# Patient Record
Sex: Female | Born: 1945 | Race: White | Hispanic: No | Marital: Married | Smoking: Never smoker
Health system: Southern US, Community
[De-identification: ages and names within clinical notes are randomized; demographics above are authoritative.]

## PROBLEM LIST (undated history)

## (undated) DIAGNOSIS — M1990 Unspecified osteoarthritis, unspecified site: Secondary | ICD-10-CM

## (undated) DIAGNOSIS — I10 Essential (primary) hypertension: Secondary | ICD-10-CM

## (undated) DIAGNOSIS — Z923 Personal history of irradiation: Secondary | ICD-10-CM

## (undated) DIAGNOSIS — R112 Nausea with vomiting, unspecified: Secondary | ICD-10-CM

## (undated) DIAGNOSIS — Z973 Presence of spectacles and contact lenses: Secondary | ICD-10-CM

## (undated) DIAGNOSIS — C50919 Malignant neoplasm of unspecified site of unspecified female breast: Secondary | ICD-10-CM

## (undated) DIAGNOSIS — K219 Gastro-esophageal reflux disease without esophagitis: Secondary | ICD-10-CM

## (undated) DIAGNOSIS — Z9889 Other specified postprocedural states: Secondary | ICD-10-CM

## (undated) HISTORY — PX: EYE SURGERY: SHX253

## (undated) HISTORY — PX: DILATION AND CURETTAGE OF UTERUS: SHX78

## (undated) HISTORY — DX: Essential (primary) hypertension: I10

## (undated) HISTORY — DX: Unspecified osteoarthritis, unspecified site: M19.90

## (undated) HISTORY — PX: COLONOSCOPY: SHX174

## (undated) HISTORY — DX: Malignant neoplasm of unspecified site of unspecified female breast: C50.919

## (undated) HISTORY — PX: POLYPECTOMY: SHX149

---

## 1997-08-25 ENCOUNTER — Ambulatory Visit (HOSPITAL_COMMUNITY): Admission: RE | Admit: 1997-08-25 | Discharge: 1997-08-25 | Payer: Self-pay | Admitting: Surgery

## 1997-12-24 ENCOUNTER — Emergency Department (HOSPITAL_COMMUNITY): Admission: EM | Admit: 1997-12-24 | Discharge: 1997-12-24 | Payer: Self-pay | Admitting: Emergency Medicine

## 1997-12-24 ENCOUNTER — Encounter: Payer: Self-pay | Admitting: Emergency Medicine

## 1998-09-01 ENCOUNTER — Other Ambulatory Visit: Admission: RE | Admit: 1998-09-01 | Discharge: 1998-09-01 | Payer: Self-pay | Admitting: *Deleted

## 1999-07-23 ENCOUNTER — Encounter (INDEPENDENT_AMBULATORY_CARE_PROVIDER_SITE_OTHER): Payer: Self-pay

## 1999-07-23 ENCOUNTER — Other Ambulatory Visit: Admission: RE | Admit: 1999-07-23 | Discharge: 1999-07-23 | Payer: Self-pay | Admitting: Obstetrics and Gynecology

## 1999-07-26 ENCOUNTER — Other Ambulatory Visit: Admission: RE | Admit: 1999-07-26 | Discharge: 1999-07-26 | Payer: Self-pay | Admitting: Obstetrics and Gynecology

## 2000-05-05 ENCOUNTER — Ambulatory Visit (HOSPITAL_COMMUNITY): Admission: RE | Admit: 2000-05-05 | Discharge: 2000-05-05 | Payer: Self-pay | Admitting: Gastroenterology

## 2000-05-05 ENCOUNTER — Encounter (INDEPENDENT_AMBULATORY_CARE_PROVIDER_SITE_OTHER): Payer: Self-pay

## 2000-08-23 ENCOUNTER — Other Ambulatory Visit: Admission: RE | Admit: 2000-08-23 | Discharge: 2000-08-23 | Payer: Self-pay | Admitting: Obstetrics and Gynecology

## 2001-08-13 ENCOUNTER — Ambulatory Visit (HOSPITAL_COMMUNITY): Admission: RE | Admit: 2001-08-13 | Discharge: 2001-08-13 | Payer: Self-pay | Admitting: Obstetrics and Gynecology

## 2001-08-13 ENCOUNTER — Encounter (INDEPENDENT_AMBULATORY_CARE_PROVIDER_SITE_OTHER): Payer: Self-pay | Admitting: Specialist

## 2001-10-10 ENCOUNTER — Other Ambulatory Visit: Admission: RE | Admit: 2001-10-10 | Discharge: 2001-10-10 | Payer: Self-pay | Admitting: Obstetrics and Gynecology

## 2002-10-29 ENCOUNTER — Other Ambulatory Visit: Admission: RE | Admit: 2002-10-29 | Discharge: 2002-10-29 | Payer: Self-pay | Admitting: Obstetrics and Gynecology

## 2003-10-31 ENCOUNTER — Other Ambulatory Visit: Admission: RE | Admit: 2003-10-31 | Discharge: 2003-10-31 | Payer: Self-pay | Admitting: Obstetrics and Gynecology

## 2004-11-19 ENCOUNTER — Other Ambulatory Visit: Admission: RE | Admit: 2004-11-19 | Discharge: 2004-11-19 | Payer: Self-pay | Admitting: Obstetrics and Gynecology

## 2005-04-04 HISTORY — PX: HAND OSTEOPLASTY: SHX976

## 2005-12-26 ENCOUNTER — Other Ambulatory Visit: Admission: RE | Admit: 2005-12-26 | Discharge: 2005-12-26 | Payer: Self-pay | Admitting: Obstetrics and Gynecology

## 2007-01-16 ENCOUNTER — Other Ambulatory Visit: Admission: RE | Admit: 2007-01-16 | Discharge: 2007-01-16 | Payer: Self-pay | Admitting: Family Medicine

## 2009-05-01 ENCOUNTER — Encounter: Admission: RE | Admit: 2009-05-01 | Discharge: 2009-05-01 | Payer: Self-pay | Admitting: Otolaryngology

## 2009-09-30 ENCOUNTER — Other Ambulatory Visit: Admission: RE | Admit: 2009-09-30 | Discharge: 2009-09-30 | Payer: Self-pay | Admitting: Family Medicine

## 2010-08-20 NOTE — Op Note (Signed)
[facility]  Patient:    Jill Austin, Jill Austin Visit Number: [id number] MRN: [id number]          Service Type: DSU Location: [facility] Attending Physician:  Brynda Peon Dictated by:   Edwena Felty. Ashley Royalty, M.D. Proc. Date: 08/13/01 Admit Date:  08/13/2001                             Operative Report  PREOPERATIVE DIAGNOSES:       1. Postmenopausal bleeding.                               2. Known endometrial polyp.  POSTOPERATIVE DIAGNOSES:      1. Postmenopausal bleeding.                               2. Known endometrial polyp.                               3. Pathology pending.  PROCEDURES:                   1. Hysteroscopy.                               2. Dilation and curettage.                               3. Resection of polyp.  SURGEON:                      Cynthia P. Ashley Royalty, M.D.  ANESTHESIA:                   General by LMA.  ESTIMATED BLOOD LOSS:         Minimal.  COMPLICATIONS:                None.  SORBITOL DEFICIT:             60 cc.  DESCRIPTION OF PROCEDURE:     The patient was taken to the operating room. After the induction of adequate general anesthesia by LMA, she was prepped and draped in the usual fashion.  She had been placed in the dorsal lithotomy position prior to the induction of anesthesia because of a history of low back and hip pain.  After the induction of anesthesia and the prep and draped, the cervix was grasped with a single-tooth tenaculum and the uterus was sounded to 7 cm.  The cervix was dilated to a #31 Pratt.  The operative hysteroscope was introduced.  Sorbitol was used as a distention medium, with the pressure set at 70 mmHg.  Inspection of the uterus revealed there to be a 1 cm intrauterine wall polyp.  This was partially resected with the resectoscope.  Polyp forceps were also introduced after the scope was taken out rule out remove any section of the polyp that remained.  The hysteroscope was  reintroduced and the cavity was inspected.  The base of the polyp was resected with loop until the cavity appeared clear.  The tubal ostia were normal.  The lower uterine segment and endocervix were normal.  The hysteroscope  was then removed.  Sharp curettage was carried out and the specimen sent to pathology.  The procedure was terminated.  The patient went in satisfactory condition to postanesthesia recovery. Dictated by:   Edwena Felty. Ashley Royalty, M.D. Attending Physician:  Brynda Peon DD:  08/13/01 TD:  08/14/01 Job: [id number]

## 2010-08-20 NOTE — Procedures (Signed)
[place]  Patient:    Jill Austin, Jill Austin                        MRN: [id number] Proc. Date: 05/05/00 Attending:  Florencia Reasons, M.D. CC:         Duncan Dull, M.D.   Procedure Report  PROCEDURE:  Colonoscopy with biopsies.  INDICATION:  A 65 year old female with slight change in bowel habits and persistent unexplained left lower quadrant pain.  FINDINGS:  Some degree of sigmoid muscular thickening and prediverticular change, but no frank colitis, diverticular disease, or other source of abdominal pain identified.  DESCRIPTION OF PROCEDURE:  The nature, purpose, and risks of the procedure had been discussed with the patient, who provided written consent.  The Olympus adjustable-tension pediatric video colonoscope was advanced to the terminal ileum without significant difficulty, using some external abdominal compression to control looping, following sedation with fentanyl 70 mcg and Versed 7 mg IV.  On pullback, random mucosal biopsies were obtained along the length of the colon.  There was perhaps a little bit of erythema and granularity in the rectum, probably prep-related.  The biopsies were obtained from that area and placed in a separate jar.  No polyps, cancer, colitis, vascular malformations, or any definite diverticular disease were observed, although it appeared, especially during insertion of the scope, that there was a moderate amount of sigmoid muscular thickening and perhaps some early diverticular change without frank diverticulosis.  Retroflexion in the rectum prior to rectal biopsies was unremarkable.  There were mild to moderate internal hemorrhoids observed during pullout through the anal canal.  The patient tolerated the procedure well, and there were no apparent complications.  IMPRESSION:  Essentially normal colonoscopy.  Questionable minimal proctitis (doubt).  Mild to moderate _____ and possible early  diverticular change in the sigmoid region.  DISCUSSION:  The above findings do not necessarily account for the patients abdominal pain, which fortunately is not that bothersome for her.  PLAN:  Await pathology on biopsies.  Anticipate screening flexible sigmoidoscopy in five years.  Office follow-up as scheduled to monitor her symptoms. DD:  05/05/00 TD:  05/06/00 Job: [id number]

## 2010-10-07 ENCOUNTER — Other Ambulatory Visit: Payer: Self-pay | Admitting: Dermatology

## 2012-05-08 ENCOUNTER — Other Ambulatory Visit: Payer: Self-pay | Admitting: Radiology

## 2012-05-08 DIAGNOSIS — C50919 Malignant neoplasm of unspecified site of unspecified female breast: Secondary | ICD-10-CM

## 2012-05-08 HISTORY — DX: Malignant neoplasm of unspecified site of unspecified female breast: C50.919

## 2012-05-11 ENCOUNTER — Other Ambulatory Visit: Payer: Self-pay | Admitting: *Deleted

## 2012-05-11 ENCOUNTER — Telehealth: Payer: Self-pay | Admitting: *Deleted

## 2012-05-11 DIAGNOSIS — C50212 Malignant neoplasm of upper-inner quadrant of left female breast: Secondary | ICD-10-CM | POA: Insufficient documentation

## 2012-05-11 DIAGNOSIS — C50219 Malignant neoplasm of upper-inner quadrant of unspecified female breast: Secondary | ICD-10-CM

## 2012-05-11 NOTE — Telephone Encounter (Signed)
Confirmed [facility] for 05/16/12 at 1230 .  Instructions and contact information given.  

## 2012-05-16 ENCOUNTER — Ambulatory Visit: Payer: Medicare Other | Attending: Surgery | Admitting: Physical Therapy

## 2012-05-16 ENCOUNTER — Encounter: Payer: Self-pay | Admitting: Oncology

## 2012-05-16 ENCOUNTER — Ambulatory Visit (HOSPITAL_BASED_OUTPATIENT_CLINIC_OR_DEPARTMENT_OTHER): Payer: Medicare Other | Admitting: Oncology

## 2012-05-16 ENCOUNTER — Ambulatory Visit
Admission: RE | Admit: 2012-05-16 | Discharge: 2012-05-16 | Disposition: A | Discharge status: Home / Self Care | Payer: Medicare Other | Source: Ambulatory Visit | Attending: Radiation Oncology | Admitting: Radiation Oncology

## 2012-05-16 ENCOUNTER — Encounter: Payer: Self-pay | Admitting: *Deleted

## 2012-05-16 ENCOUNTER — Encounter (INDEPENDENT_AMBULATORY_CARE_PROVIDER_SITE_OTHER): Payer: Self-pay | Admitting: Surgery

## 2012-05-16 ENCOUNTER — Ambulatory Visit: Payer: Medicare Other

## 2012-05-16 ENCOUNTER — Other Ambulatory Visit (HOSPITAL_BASED_OUTPATIENT_CLINIC_OR_DEPARTMENT_OTHER): Payer: Medicare Other | Admitting: Lab

## 2012-05-16 ENCOUNTER — Ambulatory Visit (HOSPITAL_BASED_OUTPATIENT_CLINIC_OR_DEPARTMENT_OTHER): Payer: Medicare Other | Admitting: Surgery

## 2012-05-16 VITALS — BP 170/83 | HR 73 | Temp 97.2°F | Resp 20 | Ht 64.5 in | Wt 177.4 lb

## 2012-05-16 VITALS — BP 170/83 | HR 73 | Temp 97.2°F | Resp 20 | Ht 64.5 in | Wt 177.0 lb

## 2012-05-16 DIAGNOSIS — C50919 Malignant neoplasm of unspecified site of unspecified female breast: Secondary | ICD-10-CM

## 2012-05-16 DIAGNOSIS — IMO0001 Reserved for inherently not codable concepts without codable children: Secondary | ICD-10-CM | POA: Insufficient documentation

## 2012-05-16 DIAGNOSIS — I10 Essential (primary) hypertension: Secondary | ICD-10-CM | POA: Insufficient documentation

## 2012-05-16 DIAGNOSIS — C50219 Malignant neoplasm of upper-inner quadrant of unspecified female breast: Secondary | ICD-10-CM

## 2012-05-16 DIAGNOSIS — Z170 Estrogen receptor positive status [ER+]: Secondary | ICD-10-CM

## 2012-05-16 DIAGNOSIS — C50912 Malignant neoplasm of unspecified site of left female breast: Secondary | ICD-10-CM

## 2012-05-16 DIAGNOSIS — E782 Mixed hyperlipidemia: Secondary | ICD-10-CM | POA: Insufficient documentation

## 2012-05-16 DIAGNOSIS — R293 Abnormal posture: Secondary | ICD-10-CM | POA: Insufficient documentation

## 2012-05-16 DIAGNOSIS — K219 Gastro-esophageal reflux disease without esophagitis: Secondary | ICD-10-CM | POA: Insufficient documentation

## 2012-05-16 LAB — COMPREHENSIVE METABOLIC PANEL (CC13)
AST: 16 U/L (ref 5–34)
Albumin: 3.9 g/dL (ref 3.5–5.0)
Alkaline Phosphatase: 87 U/L (ref 40–150)
BUN: 35.9 mg/dL — ABNORMAL HIGH (ref 7.0–26.0)
Creatinine: 0.9 mg/dL (ref 0.6–1.1)
Potassium: 4.3 mEq/L (ref 3.5–5.1)

## 2012-05-16 LAB — CBC WITH DIFFERENTIAL/PLATELET
BASO%: 0.7 % (ref 0.0–2.0)
Basophils Absolute: 0 10*3/uL (ref 0.0–0.1)
EOS%: 2.1 % (ref 0.0–7.0)
HGB: 13.2 g/dL (ref 11.6–15.9)
MCH: 29.8 pg (ref 25.1–34.0)
MCHC: 33.5 g/dL (ref 31.5–36.0)
MCV: 89 fL (ref 79.5–101.0)
MONO%: 8 % (ref 0.0–14.0)
RBC: 4.44 10*6/uL (ref 3.70–5.45)
RDW: 13.6 % (ref 11.2–14.5)

## 2012-05-16 NOTE — Patient Instructions (Signed)
Offices should call you in a few days to set up surgery to remove the left breast cancer and to remove some of the sentinel lymph nodes from the left armpit area. If you have not heard from my office by Friday, please call to check on the status of the scheduling. The office number is [phone]

## 2012-05-16 NOTE — Progress Notes (Signed)
Patient ID: Jill Austin, female   DOB: 04/17/1945, 66 y.o.   MRN: [id number]  Chief Complaint  Patient presents with  . Breast Cancer    left    HPI Elveta B Fredman is a 66 y.o. female.  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 She was unaware of any mass. She had a significant other breast problems. She has a negative family history for breast cancer. She is having no breast symptoms.  HPI  Past Medical History  Diagnosis Date  . Breast cancer   . Hypertension   . Arthritis     No past surgical history on file.  No family history on file.  Social History History  Substance Use Topics  . Smoking status: Never Smoker   . Smokeless tobacco: Not on file  . Alcohol Use: 6.0 oz/week    10 Glasses of wine per week    Allergies not on file  No current outpatient prescriptions on file.   No current facility-administered medications for this visit.    Review of Systems Review of Systems  Constitutional: Negative for fever, chills and unexpected weight change.  HENT: Negative for hearing loss, congestion, sore throat, trouble swallowing and voice change.   Eyes: Negative for visual disturbance.  Respiratory: Negative for cough and wheezing.   Cardiovascular: Negative for chest pain, palpitations and leg swelling.  Gastrointestinal: Negative for nausea, vomiting, abdominal pain, diarrhea, constipation, blood in stool, abdominal distention and anal bleeding.  Genitourinary: Negative for hematuria, vaginal bleeding and difficulty urinating.  Musculoskeletal: Negative for arthralgias.  Skin: Negative for rash and wound.  Neurological: Negative for seizures, syncope and headaches.  Hematological: Negative for adenopathy. Does not bruise/bleed easily.  Psychiatric/Behavioral: Negative for confusion.    Blood pressure 170/83, pulse 73, temperature 97.2 F (36.2 C), resp. rate 20, height 5' 4.5"  (1.638 m), weight 177 lb (80.287 kg).  Physical Exam Physical Exam  Vitals reviewed. Constitutional: She is oriented to person, place, and time. She appears well-developed and well-nourished. No distress.  HENT:  Head: Normocephalic and atraumatic.  Mouth/Throat: Oropharynx is clear and moist.  Eyes: Conjunctivae and EOM are normal. Pupils are equal, round, and reactive to light. No scleral icterus.  Neck: Normal range of motion. Neck supple. No tracheal deviation present. No thyromegaly present.  Cardiovascular: Normal rate, regular rhythm, normal heart sounds and intact distal pulses.  Exam reveals no gallop and no friction rub.   No murmur heard. Pulmonary/Chest: Effort normal and breath sounds normal. No respiratory distress. She has no wheezes. She has no rales. Right breast exhibits no inverted nipple, no mass, no nipple discharge, no skin change and no tenderness. Left breast exhibits mass. Left breast exhibits no inverted nipple, no nipple discharge, no skin change and no tenderness. Breasts are symmetrical.    Mass in the left breast as diagrammed. I believe this represents hematoma and not the cancer.  Abdominal: Soft. Bowel sounds are normal. She exhibits no distension and no mass. There is no tenderness. There is no rebound and no guarding.  Musculoskeletal: Normal range of motion. She exhibits no edema and no tenderness.  Lymphadenopathy:    She has no cervical adenopathy.    She has no axillary adenopathy.       Right: No supraclavicular adenopathy present.       Left: No supraclavicular adenopathy present.  Neurological: She is alert and oriented to person, place, and time.    Skin: Skin is warm and dry. No rash noted. She is not diaphoretic. No erythema.  Psychiatric: She has a normal mood and affect. Her behavior is normal. Judgment and thought content normal.    Data Reviewed Path: ADDITIONAL INFORMATION: CHROMOGENIC IN-SITU HYBRIDIZATION Interpretation HER-2/NEU BY  CISH - NEGATIVE, NO AMPLIFICATION OF HER-2 DETECTED. THE RATIO OF HER-2: CEP 17 SIGNALS WAS 1.38. H. CATHERINE LI MD Pathologist, Electronic Signature ( Signed 05/11/2012) PROGNOSTIC INDICATORS - ACIS Results IMMUNOHISTOCHEMICAL AND MORPHOMETRIC ANALYSIS BY THE AUTOMATED CELLULAR IMAGING SYSTEM (ACIS) Estrogen Receptor (Negative, <1%): 100%,POSITIVE, STRONG STAINING INTENSITY Progesterone Receptor (Negative, <1%): 100%, POSITIVE, STRONG STAINING INTENSITY Proliferation Marker Ki67 by M IB-1 (Low<20%): 12% All controls stained appropriately H. CATHERINE LI MD Pathologist, Electronic Signature ( Signed 05/11/2012) FINAL DIAGNOSIS 1 of 2 FINAL for Klingberg, Breane B ([id number]) Diagnosis Breast, left, needle core biopsy - INVASIVE MAMMARY CARCINOMA. - SEE COMMENT. Microscopic Comment Although definitive grading of breast carcinoma is best done on excision, the features of the tumor from the left needle core biopsy are compatible with a grade II breast carcinoma. Breast prognostic marker will be performed and reported in an addendum. The findings are called to [facility] on 05/09/12. Dr. Rund has seen this case in consultation with agreement. (CRR:gt, 05/09/12) ROBERT HILLARD MD Pathologist, Electronic Signature  I have reviewed the pathology slides with the pathologist, and the mammogram films etc with the radiologist Assessmen    Clinical stage I left breast cancer upper outer quadrant, receptor positive, HER-2 negative     Plan    I have explained the pathophysiology and staging of breast cancer with particular attention to her exact situation. We discussed the multidisciplinary approach to breast cancer which often includes both medical and radiation oncology consultations.  We also discussed surgical options for the treatment of breast cancer including lumpectomy and mastectomy with possible reconstructive surgery. In addition we talked about the evaluation and  management of lymph nodes including a description of sentinel lymph node biopsy and axillary dissections. We reviewed potential complications and risks including bleeding, infection, numbness,  lymphedema, and the potential need for additional surgery.  She understands that for patients who are candidate for lumpectomy or mastectomy there is an equal survival rate with either technique, but a slightly higher local recurrence rate with lumpectomy. In addition she knows that a lumpectomy usually requires postoperative radiation as part of the management of the breast cancer.  We have discussed the likely postoperative course and plans for followup.  I have given the patient some written information that reviewed all of these issues. I believe her questions are answered and that she has a good understanding of the issues.  I think she is an excellent candidate for a wire localized lumpectomy and sentinel lymph node evaluation. I do not think she will benefit from a mastectomy. I don't think she is a candidate for a MammoSite device as his tumor is fairly high and there is not a lot of surrounding breast tissue.        Torion Hulgan J 05/16/2012, 10:07 AM    

## 2012-05-16 NOTE — Progress Notes (Signed)
Checked in new patient. No financial issues. Patient does have her [facility] packet.

## 2012-05-16 NOTE — Progress Notes (Signed)
Radiation Oncology         [phone]) [phone] ________________________________  Initial outpatient Consultation - Date: 05/16/2012   Name: Jill Austin MRN: [id number]   DOB: 25-Sep-1945  REFERRING PHYSICIAN: Currie Paris, MD  DIAGNOSIS: The encounter diagnosis was Cancer of upper-inner quadrant of female breast.  HISTORY OF PRESENT ILLNESS::Jill Austin is a 67 y.o. female  who underwent a screening mammogram. An abnormality was seen in the left breast. A biopsy revealed invasive mammary carcinoma with some ductal formation which was felt to be grade 2 ER/PR positive HER-2 negative Ki-67 12%. She is done well since her biopsy. She is interested in breast conservation. He is accompanied by her husband today.  PREVIOUS RADIATION THERAPY: No  PAST MEDICAL HISTORY:  has a past medical history of Breast cancer; Hypertension; and Arthritis.    PAST SURGICAL HISTORY:No past surgical history on file.  FAMILY HISTORY: @[facility] @  SOCIAL HISTORY:  History  Substance Use Topics  . Smoking status: Never Smoker   . Smokeless tobacco: Not on file  . Alcohol Use: 6.0 oz/week    10 Glasses of wine per week    ALLERGIES: Penicillins and Vicodin  MEDICATIONS:  Current Outpatient Prescriptions  Medication Sig Dispense Refill  . amLODipine (NORVASC) 5 MG tablet       . bisoprolol-hydrochlorothiazide (ZIAC) 5-6.25 MG per tablet       . Calcium Carbonate-Vitamin D (CALCIUM 600 + D PO) Take 600 mg by mouth daily.      . fish oil-omega-3 fatty acids 1000 MG capsule Take 2 g by mouth daily.      Marland Kitchen glucosamine-chondroitin 500-400 MG tablet Take 1 tablet by mouth 3 (three) times daily.      . meloxicam (MOBIC) 15 MG tablet       . Multiple Vitamin (MULTIVITAMIN) capsule Take 1 capsule by mouth daily.      Marland Kitchen omeprazole (PRILOSEC) 20 MG capsule       . sertraline (ZOLOFT) 100 MG tablet        No current facility-administered medications for this encounter.    REVIEW OF SYSTEMS:  A 15 point  review of systems is documented in the electronic medical record. This was obtained by the nursing staff. However, I reviewed this with the patient to discuss relevant findings and make appropriate changes.  Pertinent items are noted in HPI.   PHYSICAL EXAM: There were no vitals filed for this visit.. She is a pleasant female in no distress sitting comfortably examining table. She has large pendulous breasts bilaterally. He is some bruising in the lateral aspect of the right breast. This is superior and lateral. As no abnormalities of the left breast. She has no palpable cervical or supraclavicular adenopathy. No palpable axillary adenopathy bilaterally.  LABORATORY DATA:  Lab Results  Component Value Date   WBC 6.1 05/16/2012   HGB 13.2 05/16/2012   HCT 39.5 05/16/2012   MCV 89.0 05/16/2012   PLT 180 05/16/2012   Lab Results  Component Value Date   NA 139 05/16/2012   K 4.3 05/16/2012   CL 103 05/16/2012   CO2 29 05/16/2012   Lab Results  Component Value Date   ALT 12 05/16/2012   AST 16 05/16/2012   ALKPHOS 87 05/16/2012   BILITOT 1.11 05/16/2012     RADIOGRAPHY: No results found.    IMPRESSION: 67 year old female with a T1 C. N0 invasive mammary carcinoma of the left breast  PLAN: I discussed breast conservation with Jill Austin.  She is interested in proceeding forward. We discussed the role of radiation in decreasing local failure in patients who undergo breast conservation. We discussed the process of simulation the placement tattoos. We discussed 6 weeks of treatment as an outpatient. We discussed the possible side effects including but not limited to skin redness and fatigue. We discussed that she is a candidate for Oncotype testing and that if she did require chemotherapy that radiation will begin following chemotherapy. She has met with the surgeon, medical oncologist, a member of our patient family support team as well as our physical therapist.  I spent 40 minutes  face to face with the  patient and more than 50% of that time was spent in counseling and/or coordination of care.   ------------------------------------------------  Lurline Hare, MD

## 2012-05-17 ENCOUNTER — Telehealth: Payer: Self-pay | Admitting: Oncology

## 2012-05-17 NOTE — Progress Notes (Signed)
ID: Jill Austin   DOB: 03/29/1946  MR#: [id number]  [id number]#:[id number]  PCP: [name], MD GYN: Aram Beecham Romine) SU: Cicero Duck OTHER MD: Lurline Hare, Jeralyn Ruths, Christine McCuen   HISTORY OF PRESENT ILLNESS: Jill Austin had routine screening mammography at [facility] 05/05/2011, showing heterogeneously dense breasts. There were no findings of concern. On 05/07/2012, a new spiculated mass was noted in the left breast, and the patient was recalled for left breast ultrasound 05/08/2012. This confirmed a hypoechoic mass measuring 1.3 cm, at the 11:00 position 11 cm from the nipple. The left axilla was unremarkable.  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  The patient's subsequent history is as detailed below.  INTERVAL HISTORY: Jill Austin was seen at the multidisciplinary breast cancer clinic 05/16/2012 accompanied by her husband Ron  REVIEW OF SYSTEMS: She was having no symptoms that led to the recent mammogram, which was routinely scheduled. Currently she denies unusual headaches, visual changes, cough, phlegm production, pleurisy, chest pain or pressure, change in bowel or bladder habits, unexplained fatigue or unexplained weight weight loss, rash, bleeding, fever, or persistent or unusual pain. A detailed review of systems was entirely negative.  PAST MEDICAL HISTORY: Past Medical History  Diagnosis Date  . Breast cancer   . Hypertension   . Arthritis    tinnitus and hearing loss left year; history of remote panic attacks; GERD; wrinkle over the left retina  PAST SURGICAL HISTORY: No past surgical history on file. Hand surgery under [facility] Removal of uterine polyp  FAMILY HISTORY No family history on file. The patient's father died at the age of 59 from heart problems. The  patient's mother died at the age of 63, from heart problems. Jill Austin had one brother, no sisters. There is no history of breast or ovarian cancer in the family to her knowledge.  GYNECOLOGIC HISTORY: Menarche age 67, first live birth age 67, she is GX P2, last menstrual period approximately 1998. She took hormone replacement for approximately 7 years.  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 Her husband [name] (goes by "wrong") is retired from [employer]. He is having significant spine problems. Their daughter Kwanza Cancelliere teaches 4-year-olds in [employer] in [place]; daughter Jatziri Goffredo lives in [place] and is a homemaker. The patient has 4 grandchildren. She attends the [employer]  ADVANCED DIRECTIVES: In place  HEALTH MAINTENANCE: History  Substance Use Topics  . Smoking status: Never Smoker   . Smokeless tobacco: Not on file  . Alcohol Use: 6.0 oz/week    10 Glasses of wine per week     Colonoscopy: 2002  PAP: 2011  Bone density: August 2012; lowest T score -1.7  Lipid panel:  Allergies  Allergen Reactions  . Penicillins Other (See Comments)    headache  . Vicodin (Hydrocodone-Acetaminophen) Other (See Comments)    Hyper     Current Outpatient Prescriptions  Medication Sig Dispense Refill  . amLODipine (NORVASC) 5 MG tablet       . bisoprolol-hydrochlorothiazide (ZIAC) 5-6.25 MG per tablet       . Calcium Carbonate-Vitamin D (CALCIUM 600 + D PO) Take 600 mg by mouth daily.      . fish oil-omega-3 fatty acids 1000 MG capsule Take 2 g by mouth daily.      Marland Kitchen  glucosamine-chondroitin 500-400 MG tablet Take 1 tablet by mouth 3 (three) times daily.      . meloxicam (MOBIC) 15 MG tablet       . Multiple Vitamin (MULTIVITAMIN) capsule Take 1 capsule by mouth daily.      Marland Kitchen omeprazole (PRILOSEC) 20 MG capsule       . sertraline (ZOLOFT) 100 MG tablet        No current facility-administered  medications for this visit.    OBJECTIVE: Middle-aged white woman who appears well Filed Vitals:   05/16/12 0909  BP: 170/83  Pulse: 73  Temp: 97.2 F (36.2 C)  Resp: 20     Body mass index is 29.99 kg/(m^2).    ECOG FS: 0  Sclerae unicteric Oropharynx clear No cervical or supraclavicular adenopathy Lungs no rales or rhonchi Heart regular rate and rhythm Abd benign MSK no focal spinal tenderness, no peripheral edema Neuro: nonfocal Breasts: The right breast is unremarkable. The left breast is status post recent biopsy. There is moderate ecchymosis. There are no skin or nipple changes of concern. The left axilla is benign.   LAB RESULTS: Lab Results  Component Value Date   WBC 6.1 05/16/2012   NEUTROABS 3.6 05/16/2012   HGB 13.2 05/16/2012   HCT 39.5 05/16/2012   MCV 89.0 05/16/2012   PLT 180 05/16/2012      Chemistry      Component Value Date/Time   NA 139 05/16/2012 0849   K 4.3 05/16/2012 0849   CL 103 05/16/2012 0849   CO2 29 05/16/2012 0849   BUN 35.9* 05/16/2012 0849   CREATININE 0.9 05/16/2012 0849      Component Value Date/Time   CALCIUM 9.8 05/16/2012 0849   ALKPHOS 87 05/16/2012 0849   AST 16 05/16/2012 0849   ALT 12 05/16/2012 0849   BILITOT 1.11 05/16/2012 0849       Lab Results  Component Value Date   LABCA2 24 05/16/2012    No components found with this basename: [id number]    No results found for this basename: INR,  in the last 168 hours  Urinalysis No results found for this basename: colorurine, appearanceur, labspec, phurine, glucoseu, hgbur, bilirubinur, ketonesur, proteinur, urobilinogen, nitrite, leukocytesur    STUDIES: No results found.  ASSESSMENT: 67 y.o. [place] woman status post left breast biopsy 05/08/2012 for a clinical T1c N0, stage IA invasive ductal carcinoma, grade 2, estrogen and progesterone receptor positive both at 100%, HER-2/neu negative, with an MIB-1 of 12%.  PLAN: Jill Austin is a very good candidate for breast conservation  surgery, to be followed by radiation and antiestrogen therapy. An Oncotype DX will be sent and will help [place] make the decision whether or not she will need chemotherapy, as per NCCN guidelines. However my expectation is that the benefit from chemotherapy will be small enough that he we should be able to safely forgo that option.  We spent the better part of her hour-long visit today discussing the biology of her tumor, treatment options, and decision-making. She is going to return to see me in approximately 5 weeks by which time we should have the Oncotype results and should be able to make a definitive decision regarding systemic adjuvant treatment.   MAGRINAT,GUSTAV C    05/17/2012

## 2012-05-17 NOTE — Telephone Encounter (Signed)
S/w the pt and she is aware of her md appt in march

## 2012-05-22 ENCOUNTER — Other Ambulatory Visit: Payer: Self-pay | Admitting: Oncology

## 2012-05-22 ENCOUNTER — Telehealth: Payer: Self-pay | Admitting: *Deleted

## 2012-05-22 NOTE — Telephone Encounter (Signed)
Spoke to pt concerning [facility] from 2/12.  Pt denies questions or concerns regarding dx or treatment care plan.  Confirmed surgery date and future appts.  Encourage pt to call with needs.  Received verbal understanding.  Contact information given.

## 2012-05-23 ENCOUNTER — Telehealth: Payer: Self-pay | Admitting: Oncology

## 2012-05-23 NOTE — Telephone Encounter (Signed)
Per GM moved 3/21 appt o 3/28 @ 9am due to he will be out of town (Friday). lmonvm for pt re change w/new d/t. Mailed schedule.

## 2012-06-04 ENCOUNTER — Other Ambulatory Visit: Payer: Self-pay

## 2012-06-04 ENCOUNTER — Encounter (HOSPITAL_BASED_OUTPATIENT_CLINIC_OR_DEPARTMENT_OTHER): Payer: Self-pay | Admitting: *Deleted

## 2012-06-04 ENCOUNTER — Ambulatory Visit
Admission: RE | Admit: 2012-06-04 | Discharge: 2012-06-04 | Disposition: A | Discharge status: Home / Self Care | Payer: Medicare Other | Source: Ambulatory Visit | Attending: Surgery | Admitting: Surgery

## 2012-06-04 ENCOUNTER — Encounter (HOSPITAL_BASED_OUTPATIENT_CLINIC_OR_DEPARTMENT_OTHER)
Admission: RE | Admit: 2012-06-04 | Discharge: 2012-06-04 | Disposition: A | Discharge status: Home / Self Care | Payer: Medicare Other | Source: Ambulatory Visit | Attending: Surgery | Admitting: Surgery

## 2012-06-04 LAB — BASIC METABOLIC PANEL
BUN: 22 mg/dL (ref 6–23)
Chloride: 103 mEq/L (ref 96–112)
GFR calc Af Amer: >90 mL/min (ref >90)
Glucose, Bld: 103 mg/dL — ABNORMAL HIGH (ref 70–99)
Potassium: 3.8 mEq/L (ref 3.5–5.1)
Sodium: 139 mEq/L (ref 135–145)

## 2012-06-04 NOTE — Progress Notes (Signed)
Pt here for cxr-labs ekg

## 2012-06-05 ENCOUNTER — Ambulatory Visit (HOSPITAL_BASED_OUTPATIENT_CLINIC_OR_DEPARTMENT_OTHER): Payer: Medicare Other | Admitting: Certified Registered Nurse Anesthetist

## 2012-06-05 ENCOUNTER — Ambulatory Visit (HOSPITAL_BASED_OUTPATIENT_CLINIC_OR_DEPARTMENT_OTHER)
Admission: RE | Admit: 2012-06-05 | Discharge: 2012-06-05 | Disposition: A | Discharge status: Home / Self Care | Payer: Medicare Other | Source: Ambulatory Visit | Attending: Surgery | Admitting: Surgery

## 2012-06-05 ENCOUNTER — Ambulatory Visit (HOSPITAL_COMMUNITY): Payer: Medicare Other

## 2012-06-05 ENCOUNTER — Encounter (HOSPITAL_BASED_OUTPATIENT_CLINIC_OR_DEPARTMENT_OTHER): Payer: Self-pay | Admitting: *Deleted

## 2012-06-05 ENCOUNTER — Encounter (HOSPITAL_BASED_OUTPATIENT_CLINIC_OR_DEPARTMENT_OTHER): Payer: Self-pay | Admitting: Certified Registered Nurse Anesthetist

## 2012-06-05 ENCOUNTER — Encounter (HOSPITAL_BASED_OUTPATIENT_CLINIC_OR_DEPARTMENT_OTHER)
Admission: RE | Disposition: A | Discharge status: Home / Self Care | Payer: Self-pay | Source: Ambulatory Visit | Attending: Surgery

## 2012-06-05 DIAGNOSIS — D0590 Unspecified type of carcinoma in situ of unspecified breast: Secondary | ICD-10-CM

## 2012-06-05 DIAGNOSIS — C50219 Malignant neoplasm of upper-inner quadrant of unspecified female breast: Secondary | ICD-10-CM | POA: Insufficient documentation

## 2012-06-05 DIAGNOSIS — I10 Essential (primary) hypertension: Secondary | ICD-10-CM | POA: Insufficient documentation

## 2012-06-05 DIAGNOSIS — M129 Arthropathy, unspecified: Secondary | ICD-10-CM | POA: Insufficient documentation

## 2012-06-05 DIAGNOSIS — C50212 Malignant neoplasm of upper-inner quadrant of left female breast: Secondary | ICD-10-CM

## 2012-06-05 DIAGNOSIS — Z170 Estrogen receptor positive status [ER+]: Secondary | ICD-10-CM | POA: Insufficient documentation

## 2012-06-05 HISTORY — PX: BREAST LUMPECTOMY WITH NEEDLE LOCALIZATION AND AXILLARY SENTINEL LYMPH NODE BX: SHX5760

## 2012-06-05 HISTORY — DX: Other specified postprocedural states: Z98.890

## 2012-06-05 HISTORY — DX: Gastro-esophageal reflux disease without esophagitis: K21.9

## 2012-06-05 HISTORY — DX: Presence of spectacles and contact lenses: Z97.3

## 2012-06-05 HISTORY — DX: Nausea with vomiting, unspecified: R11.2

## 2012-06-05 SURGERY — BREAST LUMPECTOMY WITH NEEDLE LOCALIZATION AND AXILLARY SENTINEL LYMPH NODE BX
Anesthesia: General | Site: Breast | Laterality: Left | Wound class: Clean

## 2012-06-05 MED ORDER — GLYCOPYRROLATE 0.2 MG/ML IJ SOLN
INTRAMUSCULAR | Status: DC | PRN
  Administered 2012-06-05: .2 mg via INTRAVENOUS

## 2012-06-05 MED ORDER — PROPOFOL 10 MG/ML IV BOLUS
INTRAVENOUS | Status: DC | PRN
  Administered 2012-06-05: 150 mg via INTRAVENOUS

## 2012-06-05 MED ORDER — ACETAMINOPHEN 10 MG/ML IV SOLN
1000.0000 mg | Freq: Once | INTRAVENOUS | Status: AC
  Administered 2012-06-05: 1000 mg via INTRAVENOUS

## 2012-06-05 MED ORDER — DEXAMETHASONE SODIUM PHOSPHATE 4 MG/ML IJ SOLN
INTRAMUSCULAR | Status: DC | PRN
  Administered 2012-06-05: 10 mg via INTRAVENOUS

## 2012-06-05 MED ORDER — PHENYLEPHRINE HCL 10 MG/ML IJ SOLN
10.0000 mg | INTRAVENOUS | Status: DC | PRN
  Administered 2012-06-05: 50 ug/min via INTRAVENOUS

## 2012-06-05 MED ORDER — OXYCODONE HCL 5 MG/5ML PO SOLN: 5.0000 mg | Freq: Once | ORAL | Status: AC | PRN

## 2012-06-05 MED ORDER — MIDAZOLAM HCL 2 MG/2ML IJ SOLN
1.0000 mg | INTRAMUSCULAR | Status: DC | PRN
  Administered 2012-06-05: 1 mg via INTRAVENOUS

## 2012-06-05 MED ORDER — FENTANYL CITRATE 0.05 MG/ML IJ SOLN: 50.0000 ug | INTRAMUSCULAR | Status: DC | PRN

## 2012-06-05 MED ORDER — ONDANSETRON HCL 4 MG/2ML IJ SOLN
INTRAMUSCULAR | Status: DC | PRN
  Administered 2012-06-05: 4 mg via INTRAVENOUS

## 2012-06-05 MED ORDER — HYDROMORPHONE HCL PF 1 MG/ML IJ SOLN
0.2500 mg | INTRAMUSCULAR | Status: DC | PRN
  Administered 2012-06-05 (×3): 0.25 mg via INTRAVENOUS

## 2012-06-05 MED ORDER — TECHNETIUM TC 99M SULFUR COLLOID FILTERED
1.0000 | Freq: Once | INTRAVENOUS | Status: AC | PRN
  Administered 2012-06-05: 1 via INTRADERMAL

## 2012-06-05 MED ORDER — BUPIVACAINE HCL (PF) 0.25 % IJ SOLN
INTRAMUSCULAR | Status: DC | PRN
  Administered 2012-06-05: 30 mL

## 2012-06-05 MED ORDER — SODIUM CHLORIDE 0.9 % IJ SOLN
INTRAMUSCULAR | Status: DC | PRN
  Administered 2012-06-05: 15:00:00

## 2012-06-05 MED ORDER — FENTANYL CITRATE 0.05 MG/ML IJ SOLN
50.0000 ug | INTRAMUSCULAR | Status: DC | PRN
  Administered 2012-06-05: 50 ug via INTRAVENOUS

## 2012-06-05 MED ORDER — HYDROMORPHONE HCL 2 MG PO TABS: 2.0000 mg | ORAL_TABLET | ORAL | Status: DC | PRN

## 2012-06-05 MED ORDER — CHLORHEXIDINE GLUCONATE 4 % EX LIQD: 1.0000 "application " | Freq: Once | CUTANEOUS | Status: DC

## 2012-06-05 MED ORDER — CIPROFLOXACIN IN D5W 400 MG/200ML IV SOLN
400.0000 mg | INTRAVENOUS | Status: AC
  Administered 2012-06-05: 400 mg via INTRAVENOUS

## 2012-06-05 MED ORDER — SCOPOLAMINE 1 MG/3DAYS TD PT72
1.0000 | MEDICATED_PATCH | TRANSDERMAL | Status: DC
  Administered 2012-06-05: 1.5 mg via TRANSDERMAL

## 2012-06-05 MED ORDER — OXYCODONE HCL 5 MG PO TABS
5.0000 mg | ORAL_TABLET | Freq: Once | ORAL | Status: AC | PRN
  Administered 2012-06-05: 5 mg via ORAL

## 2012-06-05 MED ORDER — LACTATED RINGERS IV SOLN
INTRAVENOUS | Status: DC
  Administered 2012-06-05: 14:00:00 via INTRAVENOUS

## 2012-06-05 MED ORDER — MIDAZOLAM HCL 2 MG/2ML IJ SOLN: 1.0000 mg | INTRAMUSCULAR | Status: DC | PRN

## 2012-06-05 MED ORDER — FENTANYL CITRATE 0.05 MG/ML IJ SOLN
INTRAMUSCULAR | Status: DC | PRN
  Administered 2012-06-05 (×2): 25 ug via INTRAVENOUS

## 2012-06-05 MED ORDER — LIDOCAINE HCL (CARDIAC) 20 MG/ML IV SOLN
INTRAVENOUS | Status: DC | PRN
  Administered 2012-06-05: 50 mg via INTRAVENOUS

## 2012-06-05 MED ORDER — EPHEDRINE SULFATE 50 MG/ML IJ SOLN
INTRAMUSCULAR | Status: DC | PRN
  Administered 2012-06-05 (×2): 10 mg via INTRAVENOUS

## 2012-06-05 SURGICAL SUPPLY — 63 items
APPLIER CLIP 11 MED OPEN (CLIP)
APPLIER CLIP 9.375 MED OPEN (MISCELLANEOUS)
BINDER BREAST XLRG (GAUZE/BANDAGES/DRESSINGS) ×2 IMPLANT
BLADE HEX COATED 2.75 (ELECTRODE) ×2 IMPLANT
BLADE SURG 15 STRL LF DISP TIS (BLADE) ×2 IMPLANT
BLADE SURG 15 STRL SS (BLADE) ×2
CANISTER SUCTION 1200CC (MISCELLANEOUS) ×2 IMPLANT
CHLORAPREP W/TINT 26ML (MISCELLANEOUS) ×2 IMPLANT
CLIP APPLIE 11 MED OPEN (CLIP) IMPLANT
CLIP APPLIE 9.375 MED OPEN (MISCELLANEOUS) IMPLANT
CLIP TI MEDIUM 6 (CLIP) ×4 IMPLANT
CLIP TI WIDE RED SMALL 6 (CLIP) ×6 IMPLANT
CLOTH BEACON ORANGE TIMEOUT ST (SAFETY) ×2 IMPLANT
COVER MAYO STAND STRL (DRAPES) ×2 IMPLANT
COVER PROBE 5X48 (MISCELLANEOUS)
COVER PROBE W GEL 5X96 (DRAPES) ×2 IMPLANT
COVER TABLE BACK 60X90 (DRAPES) ×2 IMPLANT
DECANTER SPIKE VIAL GLASS SM (MISCELLANEOUS) IMPLANT
DERMABOND ADVANCED (GAUZE/BANDAGES/DRESSINGS) ×2
DERMABOND ADVANCED .7 DNX12 (GAUZE/BANDAGES/DRESSINGS) ×2 IMPLANT
DEVICE DUBIN W/COMP PLATE 8390 (MISCELLANEOUS) ×2 IMPLANT
DRAIN CHANNEL 19F RND (DRAIN) IMPLANT
DRAPE LAPAROSCOPIC ABDOMINAL (DRAPES) ×2 IMPLANT
DRAPE SURG 17X23 STRL (DRAPES) IMPLANT
DRAPE UTILITY XL STRL (DRAPES) ×2 IMPLANT
DRSG EMULSION OIL 3X3 NADH (GAUZE/BANDAGES/DRESSINGS) IMPLANT
ELECT BLADE 4.0 EZ CLEAN MEGAD (MISCELLANEOUS)
ELECT REM PT RETURN 9FT ADLT (ELECTROSURGICAL) ×2
ELECTRODE BLDE 4.0 EZ CLN MEGD (MISCELLANEOUS) IMPLANT
ELECTRODE REM PT RTRN 9FT ADLT (ELECTROSURGICAL) ×1 IMPLANT
EVACUATOR SILICONE 100CC (DRAIN) IMPLANT
GLOVE BIO SURGEON STRL SZ 6.5 (GLOVE) ×4 IMPLANT
GLOVE EUDERMIC 7 POWDERFREE (GLOVE) ×4 IMPLANT
GLOVE SKINSENSE NS SZ7.0 (GLOVE) ×1
GLOVE SKINSENSE STRL SZ7.0 (GLOVE) ×1 IMPLANT
GOWN PREVENTION PLUS XLARGE (GOWN DISPOSABLE) ×4 IMPLANT
GOWN PREVENTION PLUS XXLARGE (GOWN DISPOSABLE) ×2 IMPLANT
KIT CVR 48X5XPRB PLUP LF (MISCELLANEOUS) IMPLANT
KIT MARKER MARGIN INK (KITS) ×2 IMPLANT
NDL SAFETY ECLIPSE 18X1.5 (NEEDLE) ×1 IMPLANT
NEEDLE HYPO 18GX1.5 SHARP (NEEDLE) ×1
NEEDLE HYPO 25X1 1.5 SAFETY (NEEDLE) ×4 IMPLANT
NS IRRIG 1000ML POUR BTL (IV SOLUTION) ×2 IMPLANT
PACK BASIN DAY SURGERY FS (CUSTOM PROCEDURE TRAY) ×2 IMPLANT
PENCIL BUTTON HOLSTER BLD 10FT (ELECTRODE) ×2 IMPLANT
PIN SAFETY STERILE (MISCELLANEOUS) IMPLANT
SHEET MEDIUM DRAPE 40X70 STRL (DRAPES) ×2 IMPLANT
SLEEVE SCD COMPRESS KNEE MED (MISCELLANEOUS) ×2 IMPLANT
SPONGE GAUZE 4X4 12PLY (GAUZE/BANDAGES/DRESSINGS) IMPLANT
SPONGE INTESTINAL PEANUT (DISPOSABLE) IMPLANT
SPONGE LAP 18X18 X RAY DECT (DISPOSABLE) IMPLANT
SPONGE LAP 4X18 X RAY DECT (DISPOSABLE) ×4 IMPLANT
SUT ETHILON 2 0 FS 18 (SUTURE) IMPLANT
SUT ETHILON 3 0 FSL (SUTURE) IMPLANT
SUT MNCRL AB 4-0 PS2 18 (SUTURE) ×4 IMPLANT
SUT VIC AB 4-0 BRD 54 (SUTURE) IMPLANT
SUT VICRYL 3-0 CR8 SH (SUTURE) ×4 IMPLANT
SYR CONTROL 10ML LL (SYRINGE) ×4 IMPLANT
TOWEL OR 17X24 6PK STRL BLUE (TOWEL DISPOSABLE) ×2 IMPLANT
TOWEL OR NON WOVEN STRL DISP B (DISPOSABLE) ×2 IMPLANT
TUBE CONNECTING 20X1/4 (TUBING) ×2 IMPLANT
WATER STERILE IRR 1000ML POUR (IV SOLUTION) IMPLANT
YANKAUER SUCT BULB TIP NO VENT (SUCTIONS) ×2 IMPLANT

## 2012-06-05 NOTE — Interval H&P Note (Signed)
History and Physical Interval Note:  06/05/2012 1:27 PM  Jill Austin  has presented today for surgery, with the diagnosis of left breast cancer   The various methods of treatment have been discussed with the patient and family. After consideration of risks, benefits and other options for treatment, the patient has consented to  Procedure(s): BREAST LUMPECTOMY WITH NEEDLE LOCALIZATION AND AXILLARY SENTINEL LYMPH NODE BX (Left) as a surgical intervention .  The patient's history has been reviewed, patient examined, no change in status, stable for surgery.  I have reviewed the patient's chart and labs.  Questions were answered to the patient's satisfaction.     STRECK,CHRISTIAN J

## 2012-06-05 NOTE — Anesthesia Postprocedure Evaluation (Signed)
  Anesthesia Post Note  Patient: Jill Austin  Procedure(s) Performed: Procedure(s) (LRB): BREAST LUMPECTOMY WITH NEEDLE LOCALIZATION AND AXILLARY SENTINEL LYMPH NODE BX (Left)  Anesthesia type: GA  Patient location: PACU  Post pain: Pain level controlled  Post assessment: Post-op Vital signs reviewed  Last Vitals:  Filed Vitals:   06/05/12 1630  BP: 125/109  Pulse: 72  Temp:   Resp: 17    Post vital signs: Reviewed  Level of consciousness: sedated  Complications: No apparent anesthesia complications

## 2012-06-05 NOTE — Anesthesia Preprocedure Evaluation (Signed)
Anesthesia Evaluation  Patient identified by MRN, date of birth, ID band Patient awake    Reviewed: Allergy & Precautions, H&P , NPO status , Patient's Chart, lab work & pertinent test results, reviewed documented beta blocker date and time   History of Anesthesia Complications (+) PONV  Airway Mallampati: II TM Distance: >3 FB Neck ROM: Full    Dental no notable dental hx. (+) Teeth Intact and Dental Advisory Given   Pulmonary neg pulmonary ROS,  breath sounds clear to auscultation  Pulmonary exam normal       Cardiovascular hypertension, On Medications and On Home Beta Blockers Rhythm:Regular Rate:Normal     Neuro/Psych negative neurological ROS  negative psych ROS   GI/Hepatic Neg liver ROS, GERD-  Medicated and Controlled,  Endo/Other  negative endocrine ROS  Renal/GU negative Renal ROS  negative genitourinary   Musculoskeletal   Abdominal   Peds  Hematology negative hematology ROS (+)   Anesthesia Other Findings   Reproductive/Obstetrics negative OB ROS                           Anesthesia Physical Anesthesia Plan  ASA: II  Anesthesia Plan: General   Post-op Pain Management:    Induction: Intravenous  Airway Management Planned: LMA  Additional Equipment:   Intra-op Plan:   Post-operative Plan: Extubation in OR  Informed Consent: I have reviewed the patients History and Physical, chart, labs and discussed the procedure including the risks, benefits and alternatives for the proposed anesthesia with the patient or authorized representative who has indicated his/her understanding and acceptance.   Dental advisory given  Plan Discussed with: CRNA  Anesthesia Plan Comments:         Anesthesia Quick Evaluation

## 2012-06-05 NOTE — H&P (View-Only) (Signed)
Patient ID: Jill Austin, female   DOB: October 05, 1945, 67 y.o.   MRN: [id number]  Chief Complaint  Patient presents with  . Breast Cancer    left    HPI Jill Austin is a 67 y.o. female.  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 She was unaware of any mass. She had a significant other breast problems. She has a negative family history for breast cancer. She is having no breast symptoms.  HPI  Past Medical History  Diagnosis Date  . Breast cancer   . Hypertension   . Arthritis     No past surgical history on file.  No family history on file.  Social History History  Substance Use Topics  . Smoking status: Never Smoker   . Smokeless tobacco: Not on file  . Alcohol Use: 6.0 oz/week    10 Glasses of wine per week    Allergies not on file  No current outpatient prescriptions on file.   No current facility-administered medications for this visit.    Review of Systems Review of Systems  Constitutional: Negative for fever, chills and unexpected weight change.  HENT: Negative for hearing loss, congestion, sore throat, trouble swallowing and voice change.   Eyes: Negative for visual disturbance.  Respiratory: Negative for cough and wheezing.   Cardiovascular: Negative for chest pain, palpitations and leg swelling.  Gastrointestinal: Negative for nausea, vomiting, abdominal pain, diarrhea, constipation, blood in stool, abdominal distention and anal bleeding.  Genitourinary: Negative for hematuria, vaginal bleeding and difficulty urinating.  Musculoskeletal: Negative for arthralgias.  Skin: Negative for rash and wound.  Neurological: Negative for seizures, syncope and headaches.  Hematological: Negative for adenopathy. Does not bruise/bleed easily.  Psychiatric/Behavioral: Negative for confusion.    Blood pressure 170/83, pulse 73, temperature 97.2 F (36.2 C), resp. rate 20, height 5' 4.5"  (1.638 m), weight 177 lb (80.287 kg).  Physical Exam Physical Exam  Vitals reviewed. Constitutional: She is oriented to person, place, and time. She appears well-developed and well-nourished. No distress.  HENT:  Head: Normocephalic and atraumatic.  Mouth/Throat: Oropharynx is clear and moist.  Eyes: Conjunctivae and EOM are normal. Pupils are equal, round, and reactive to light. No scleral icterus.  Neck: Normal range of motion. Neck supple. No tracheal deviation present. No thyromegaly present.  Cardiovascular: Normal rate, regular rhythm, normal heart sounds and intact distal pulses.  Exam reveals no gallop and no friction rub.   No murmur heard. Pulmonary/Chest: Effort normal and breath sounds normal. No respiratory distress. She has no wheezes. She has no rales. Right breast exhibits no inverted nipple, no mass, no nipple discharge, no skin change and no tenderness. Left breast exhibits mass. Left breast exhibits no inverted nipple, no nipple discharge, no skin change and no tenderness. Breasts are symmetrical.    Mass in the left breast as diagrammed. I believe this represents hematoma and not the cancer.  Abdominal: Soft. Bowel sounds are normal. She exhibits no distension and no mass. There is no tenderness. There is no rebound and no guarding.  Musculoskeletal: Normal range of motion. She exhibits no edema and no tenderness.  Lymphadenopathy:    She has no cervical adenopathy.    She has no axillary adenopathy.       Right: No supraclavicular adenopathy present.       Left: No supraclavicular adenopathy present.  Neurological: She is alert and oriented to person, place, and time.  Skin: Skin is warm and dry. No rash noted. She is not diaphoretic. No erythema.  Psychiatric: She has a normal mood and affect. Her behavior is normal. Judgment and thought content normal.    Data Reviewed Path: ADDITIONAL INFORMATION: CHROMOGENIC IN-SITU HYBRIDIZATION Interpretation HER-2/NEU BY  CISH - NEGATIVE, NO AMPLIFICATION OF HER-2 DETECTED. THE RATIO OF HER-2: CEP 17 SIGNALS WAS 1.38. H. CATHERINE LI MD Pathologist, Electronic Signature ( Signed 05/11/2012) PROGNOSTIC INDICATORS - ACIS Results IMMUNOHISTOCHEMICAL AND MORPHOMETRIC ANALYSIS BY THE AUTOMATED CELLULAR IMAGING SYSTEM (ACIS) Estrogen Receptor (Negative, <1%): 100%,POSITIVE, STRONG STAINING INTENSITY Progesterone Receptor (Negative, <1%): 100%, POSITIVE, STRONG STAINING INTENSITY Proliferation Marker Ki67 by M IB-1 (Low<20%): 12% All controls stained appropriately Abigail Miyamoto MD Pathologist, Electronic Signature ( Signed 05/11/2012) FINAL DIAGNOSIS 1 of 2 FINAL for Jill Austin, Jill Austin ([id number]) Diagnosis Breast, left, needle core biopsy - INVASIVE MAMMARY CARCINOMA. - SEE COMMENT. Microscopic Comment Although definitive grading of breast carcinoma is best done on excision, the features of the tumor from the left needle core biopsy are compatible with a grade II breast carcinoma. Breast prognostic marker will be performed and reported in an addendum. The findings are called to [facility] on 05/09/12. Dr. Frederica Kuster has seen this case in consultation with agreement. (CRR:gt, 05/09/12) Zandra Abts MD Pathologist, Electronic Signature  I have reviewed the pathology slides with the pathologist, and the mammogram films etc with the radiologist Assessmen    Clinical stage I left breast cancer upper outer quadrant, receptor positive, HER-2 negative     Plan    I have explained the pathophysiology and staging of breast cancer with particular attention to her exact situation. We discussed the multidisciplinary approach to breast cancer which often includes both medical and radiation oncology consultations.  We also discussed surgical options for the treatment of breast cancer including lumpectomy and mastectomy with possible reconstructive surgery. In addition we talked about the evaluation and  management of lymph nodes including a description of sentinel lymph node biopsy and axillary dissections. We reviewed potential complications and risks including bleeding, infection, numbness,  lymphedema, and the potential need for additional surgery.  She understands that for patients who are candidate for lumpectomy or mastectomy there is an equal survival rate with either technique, but a slightly higher local recurrence rate with lumpectomy. In addition she knows that a lumpectomy usually requires postoperative radiation as part of the management of the breast cancer.  We have discussed the likely postoperative course and plans for followup.  I have given the patient some written information that reviewed all of these issues. I believe her questions are answered and that she has a good understanding of the issues.  I think she is an excellent candidate for a wire localized lumpectomy and sentinel lymph node evaluation. I do not think she will benefit from a mastectomy. I don't think she is a candidate for a MammoSite device as his tumor is fairly high and there is not a lot of surrounding breast tissue.        Dhanush Jokerst J 05/16/2012, 10:07 AM

## 2012-06-05 NOTE — Interval H&P Note (Signed)
History and Physical Interval Note:  06/05/2012 1:17 PM  Jill Austin  has presented today for surgery, with the diagnosis of left breast cancer   The various methods of treatment have been discussed with the patient and family. After consideration of risks, benefits and other options for treatment, the patient has consented to  Procedure(s): BREAST LUMPECTOMY WITH NEEDLE LOCALIZATION AND AXILLARY SENTINEL LYMPH NODE BX (Left) as a surgical intervention .  The patient's history has been reviewed, patient examined, no change in status, stable for surgery.  I have reviewed the patient's chart and labs.  Questions were answered to the patient's satisfaction.     Katarzyna Wolven J

## 2012-06-05 NOTE — Op Note (Signed)
Jill Austin [birth year] [id number] 05/16/2012  Preoperative diagnosis: left breast cancer, clinical stage I, upper inner quadrant  Postoperative diagnosis: the same  Procedure: wire localized left partial mastectomy with blue dye injection and axillary sentinel lymph node dissection  Surgeon: Currie Paris, MD, FACS  Assistant: Ralene Muskrat, PA-S  Anesthesia: General   Clinical History and Indications: this patient was recently diagnosed with a stage I invasive mammary carcinoma in about the 12:00 position of the left breast. After discussion of alternative management she elected to proceed to a wire localized lumpectomy and sentinel lymph node excision.    Description of Procedure: the patient is in the preoperative area, the plan is discussed and reviewed, or localizing films reviewed in the left breast marked as the operative site.  The patient was taken to the operating room and after satisfactory general anesthesia was obtained a timeout was done. 5 cc of dilute methylene blue was injected subareolar and massaged in. A full prep and drape was done.  The guidewire entered somewhat laterally and tracked for xylene and medial direction. I was able to palpate the mass was a wire going through the area. I made a curvilinear incision directly over this, raise skin flaps were fairly thin, and a wide excision of the tumor going down to and including the fascia. By palpation was well around the tumor in all directions. Specimen mammogram confirmed the tumor appear to be in the middle of the specimen.  I irrigated make sure everything was dry. I mobilized the breast off of the fascia. I put 20 cc of 0.25% plain Marcaine for local and postop pain management. I clips to mark the margins. I closed the breast in layers with 3-0 Vicryl followed by 4-0 Monocryl subcuticular Dermabond on the skin.  Using the neoprobe identified a hot area in the axilla and a transverse incision and deepened the  incision to it entered the axillary fat. Assault lymphatic and was trying to trace that but then found a blue lymph node. Adjacent was another lymph node that had counts. These were both removed. The counts on the smaller of the 2 was about 500. The blue note, which was somewhat larger, had basically no counts.CC removed there were no other palpably abnormal nodes, no blue lymph nodes or lymphatics identified, and no other hot areas.  I injected 10 cc of 0.25% plain Marcaine and closed in layers with 3-0 Vicryl, 4-0 Monocryl subcuticular, plus Dermabond  The patient tolerated the procedure well. There no operative complications. Counts were correct. Blood loss was minimal..  Currie Paris, MD, FACS 06/05/2012 3:28 PM

## 2012-06-05 NOTE — Transfer of Care (Signed)
Immediate Anesthesia Transfer of Care Note  Patient: Jill Austin  Procedure(s) Performed: Procedure(s): BREAST LUMPECTOMY WITH NEEDLE LOCALIZATION AND AXILLARY SENTINEL LYMPH NODE BX (Left)  Patient Location: PACU  Anesthesia Type:General  Level of Consciousness: awake  Airway & Oxygen Therapy: Patient Spontanous Breathing and Patient connected to face mask oxygen  Post-op Assessment: Report given to PACU RN and Post -op Vital signs reviewed and stable  Post vital signs: Reviewed and stable  Complications: No apparent anesthesia complications

## 2012-06-07 ENCOUNTER — Telehealth (INDEPENDENT_AMBULATORY_CARE_PROVIDER_SITE_OTHER): Payer: Self-pay | Admitting: General Surgery

## 2012-06-07 ENCOUNTER — Encounter (HOSPITAL_BASED_OUTPATIENT_CLINIC_OR_DEPARTMENT_OTHER): Payer: Self-pay | Admitting: Surgery

## 2012-06-07 ENCOUNTER — Encounter: Payer: Self-pay | Admitting: *Deleted

## 2012-06-07 NOTE — Progress Notes (Signed)
Got Ok from [facility] to Order Oncotype Dx test.  Ordered Oncotype Dx test w/ [facility].  Faxed request to [facility].  Called Tammy at [facility] to verify that she received request.  Faxed PAC to [employer].

## 2012-06-07 NOTE — Telephone Encounter (Signed)
Message copied by Liliana Cline on Thu Jun 07, 2012  2:01 PM ------      Message from: Currie Paris      Created: Thu Jun 07, 2012 12:04 PM       Tell the patient that her margins are OK and her lymph nodes are negative. I will discuss in detail in the office. ------

## 2012-06-07 NOTE — Telephone Encounter (Signed)
Patient aware path results are good. Lymph nodes negative and margins ok. She will follow up in the office at her scheduled appt and call with any questions prior.  

## 2012-06-10 ENCOUNTER — Other Ambulatory Visit: Payer: Self-pay | Admitting: Oncology

## 2012-06-14 ENCOUNTER — Encounter: Payer: Self-pay | Admitting: *Deleted

## 2012-06-14 NOTE — Progress Notes (Signed)
Re-faxed PAC to Samantha at [facility].

## 2012-06-19 ENCOUNTER — Encounter (INDEPENDENT_AMBULATORY_CARE_PROVIDER_SITE_OTHER): Payer: Self-pay | Admitting: Surgery

## 2012-06-19 ENCOUNTER — Ambulatory Visit (INDEPENDENT_AMBULATORY_CARE_PROVIDER_SITE_OTHER): Payer: Medicare Other | Admitting: Surgery

## 2012-06-19 VITALS — BP 142/80 | HR 64 | Temp 98.2°F | Resp 16 | Ht 65.0 in | Wt 175.0 lb

## 2012-06-19 DIAGNOSIS — C50212 Malignant neoplasm of upper-inner quadrant of left female breast: Secondary | ICD-10-CM

## 2012-06-19 DIAGNOSIS — C50219 Malignant neoplasm of upper-inner quadrant of unspecified female breast: Secondary | ICD-10-CM

## 2012-06-19 NOTE — Patient Instructions (Addendum)
See me again in about twom months, after you have finished radiation

## 2012-06-19 NOTE — Progress Notes (Signed)
NAME: Jill Austin                                            DOB: Aug 06, 1945 DATE: 06/19/2012                                                  MRN: [id number]  CC:  Chief Complaint  Patient presents with  . Follow-up    1st po lumpy    HPI: This patient comes in for post op follow-up .Sheunderwent left lumpectomy and SLN on 06/05/12. She feels that she is doing well.Some discomfort at the lumpectomy site  PE:  VITAL SIGNS: BP 142/80  Pulse 64  Temp(Src) 98.2 F (36.8 C) (Temporal)  Resp 16  Ht 5\' 5"  (1.651 m)  Wt 175 lb (79.379 kg)  BMI 29.12 kg/m2  General: The patient appears to be healthy, NAD Breast: some selling at the lumpectopmy site, no infection. SLN site soft, no infection  DATA REVIEWED: Path noted  IMPRESSION: The patient is doing well S/P lumpectomy and SLN.  Path: Diagnosis 1. Breast, lumpectomy, Left TUMOR # 1 (1.8 CM). - INVASIVE DUCTAL CARCINOMA, GRADE III. - LYMPHOVASCULAR INVASION IDENTIFIED. - INVASIVE TUMOR IS 0.3 CM FROM NEAREST MARGIN (POSTERIOR). - DUCTAL CARCINOMA IN SITU, GRADE III. TUMOR # 2 (0.6 CM) - INVASIVE DUCTAL CARCINOMA, GRADE III - INVASIVE TUMOR IS 0.4 CM FROM NEAREST MARGIN (POSTERIOR). - DUCTAL CARCINOMA IN SITU, GRADE III 2. Lymph node, sentinel, biopsy, Left axillary - ONE LYMPH NODE, NEGATIVE FOR TUMOR (0/1). 3. Lymph node, sentinel, biopsy, Left axillary #2 - ONE LYMPH NODE, NEGATIVE FOR TUMOR (0/1).    PLAN: Will see after radiation. She has med and rad onc follow up pending. Oncotype is also pen ding. Gave her a copy of path report and discussed

## 2012-06-21 ENCOUNTER — Encounter: Payer: Self-pay | Admitting: *Deleted

## 2012-06-21 NOTE — Progress Notes (Signed)
Received Oncotype Dx results of 14.  Gave copy to MD.  Rochele Pages copy to Med Rec to scan.

## 2012-06-22 ENCOUNTER — Ambulatory Visit: Payer: Medicare Other | Admitting: Oncology

## 2012-06-29 ENCOUNTER — Ambulatory Visit (HOSPITAL_BASED_OUTPATIENT_CLINIC_OR_DEPARTMENT_OTHER): Payer: Medicare Other | Admitting: Oncology

## 2012-06-29 VITALS — BP 174/90 | HR 71 | Temp 98.3°F | Resp 20 | Ht 65.0 in | Wt 177.0 lb

## 2012-06-29 DIAGNOSIS — C50212 Malignant neoplasm of upper-inner quadrant of left female breast: Secondary | ICD-10-CM

## 2012-06-29 DIAGNOSIS — C50219 Malignant neoplasm of upper-inner quadrant of unspecified female breast: Secondary | ICD-10-CM

## 2012-06-29 DIAGNOSIS — Z170 Estrogen receptor positive status [ER+]: Secondary | ICD-10-CM

## 2012-06-29 NOTE — Progress Notes (Signed)
ID: [name]   DOB: [birth year]  MR#: [id number]  WUJ#:[id number]  PCP: [name], MD GYN: Meredeth Ide SU: Cicero Duck OTHER MD: Lurline Hare, Jeralyn Ruths, Christine McCuen   HISTORY OF PRESENT ILLNESS: Jill Austin had routine screening mammography at [facility] 05/05/2011, showing heterogeneously dense breasts. There were no findings of concern. On 05/07/2012, a new spiculated mass was noted in the left breast, and the patient was recalled for left breast ultrasound 05/08/2012. This confirmed a hypoechoic mass measuring 1.3 cm, at the 11:00 position 11 cm from the nipple. The left axilla was unremarkable.  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  The patient's subsequent history is as detailed below.  INTERVAL HISTORY: Jill Austin returns today for followup of her breast cancer accompanied by her husband Ron. Since her last visit here she had her definitive left lumpectomy. She did well with the surgery, with no significant pain, swelling, fever, or lymphedema  REVIEW OF SYSTEMS: A detailed review of systems today was otherwise entirely stable.  PAST MEDICAL HISTORY: Past Medical History  Diagnosis Date  . Breast cancer   . Hypertension   . Arthritis   . GERD (gastroesophageal reflux disease)   . Wears glasses   . PONV (postoperative nausea and vomiting)    tinnitus and hearing loss left year; history of remote panic attacks; GERD; wrinkle over the left retina  PAST SURGICAL HISTORY: Past Surgical History  Procedure Laterality Date  . Dilation and curettage of uterus    . Hand osteoplasty  2007    left [facility]  . Colonoscopy    . Breast lumpectomy with needle localization and axillary sentinel lymph node bx Left 06/05/2012    Procedure: BREAST LUMPECTOMY WITH NEEDLE LOCALIZATION  AND AXILLARY SENTINEL LYMPH NODE BX;  Surgeon: Currie Paris, MD;  Location: [facility];  Service: General;  Laterality: Left;   Hand surgery under Jill Austin Removal of uterine polyp  FAMILY HISTORY No family history on file. The patient's father died at the age of 42 from heart problems. The patient's mother died at the age of 39, from heart problems. Jill Austin had one brother, no sisters. There is no history of breast or ovarian cancer in the family to her knowledge.  GYNECOLOGIC HISTORY: Menarche age 21, first live birth age 65, she is GX P2, last menstrual period approximately 1998. She took hormone replacement for approximately 7 years.  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 Her husband Jill Austin (goes by Jill Austin is retired from [employer]. He is having significant spine problems. Their daughter Jill Austin teaches 4-year-olds in [employer] in [place]; daughter Jill Austin lives in [place] and is a homemaker. The patient has 4 grandchildren. She attends the [employer]  ADVANCED DIRECTIVES: In place  HEALTH MAINTENANCE: History  Substance Use Topics  . Smoking status: Never Smoker   . Smokeless tobacco: Not on file  . Alcohol Use: 6.0 oz/week    10 Glasses of wine per week     Colonoscopy: 2002  PAP: 2011  Bone density: August 2012; lowest T score -1.7  Lipid panel:  Allergies  Allergen Reactions  . Penicillins Other (See Comments)    headache  . Vicodin (Hydrocodone-Acetaminophen) Other (See Comments)    Hyper     Current  Outpatient Prescriptions  Medication Sig Dispense Refill  . amLODipine (NORVASC) 5 MG tablet       . bisoprolol-hydrochlorothiazide (ZIAC) 5-6.25 MG per tablet       . Calcium Carbonate-Vitamin D (CALCIUM 600 + D PO) Take 600 mg by mouth daily.      . fish oil-omega-3 fatty acids 1000 MG capsule Take 2 g by mouth daily.      Marland Kitchen  glucosamine-chondroitin 500-400 MG tablet Take 1 tablet by mouth 3 (three) times daily.      Marland Kitchen HYDROmorphone (DILAUDID) 2 MG tablet Take 1 tablet (2 mg total) by mouth every 4 (four) hours as needed for pain. Take as needed for pain  30 tablet  0  . meloxicam (MOBIC) 15 MG tablet       . Multiple Vitamin (MULTIVITAMIN) capsule Take 1 capsule by mouth daily.      Marland Kitchen omeprazole (PRILOSEC) 20 MG capsule       . sertraline (ZOLOFT) 100 MG tablet        No current facility-administered medications for this visit.    OBJECTIVE: Middle-aged white woman in no acute distress. Filed Vitals:   06/29/12 0859  BP: 174/90  Pulse: 71  Temp: 98.3 F (36.8 C)  Resp: 20     Body mass index is 29.45 kg/(m^2).    ECOG FS: 1  Sclerae unicteric Oropharynx clear No cervical or supraclavicular adenopathy Lungs no rales or rhonchi Heart regular rate and rhythm Abd benign MSK no focal spinal tenderness, no peripheral edema Neuro: nonfocal, well oriented, pleasant affect Breasts: The right breast is unremarkable. The left breast is status post  Lumpectomy. The incisions are healing nicely. There is no significant swelling or erythema. There is no dehiscence. The overall cosmetic result is good. The left axilla is benign.   LAB RESULTS: Lab Results  Component Value Date   WBC 6.1 05/16/2012   NEUTROABS 3.6 05/16/2012   HGB 14.0 06/05/2012   HCT 39.5 05/16/2012   MCV 89.0 05/16/2012   PLT 180 05/16/2012      Chemistry      Component Value Date/Time   NA 139 06/04/2012 1200   NA 139 05/16/2012 0849   K 3.8 06/04/2012 1200   K 4.3 05/16/2012 0849   CL 103 06/04/2012 1200   CL 103 05/16/2012 0849   CO2 26 06/04/2012 1200   CO2 29 05/16/2012 0849   BUN 22 06/04/2012 1200   BUN 35.9* 05/16/2012 0849   CREATININE 0.79 06/04/2012 1200   CREATININE 0.9 05/16/2012 0849      Component Value Date/Time   CALCIUM 9.2 06/04/2012 1200   CALCIUM 9.8 05/16/2012 0849   ALKPHOS 87 05/16/2012 0849   AST 16 05/16/2012 0849   ALT 12  05/16/2012 0849   BILITOT 1.11 05/16/2012 0849       Lab Results  Component Value Date   LABCA2 24 05/16/2012    No components found with this basename: [id number]    No results found for this basename: INR,  in the last 168 hours  Urinalysis No results found for this basename: colorurine,  appearanceur,  labspec,  phurine,  glucoseu,  hgbur,  bilirubinur,  ketonesur,  proteinur,  urobilinogen,  nitrite,  leukocytesur    STUDIES: No results found.  ASSESSMENT: 67 y.o. [place] woman status post left lumpectomy and sentinel lymph node sampling 06/05/2012 for a mpT1c pN0, stage IA invasive ductal carcinoma, grade 3, estrogen and progesterone receptor positive both at 100%, HER-2/neu negative, with  an MIB-1 of 12%.  (1) Oncotype DX showed a score of 14, predicting a distant recurrence risk of 9% within the next 10 years if the patient's only systemic treatment is tamoxifen for 5 years  PLAN: Jeanise understands that in addition to the risk prognosis provided by Oncotype this test gives [place] predictive information regarding the possible benefit of chemotherapy. In patients with a score like hers, the benefit of chemotherapy is minimal. Accordingly the plan will be to proceed to radiation, to be followed by antiestrogen pills.  Today we began discussing the possible toxicities, side effects and complications of anastrozole versus tamoxifen, and I gave her written information on these drugs. It will be helpful to have a bone density before her return visit here with me, and that will help [place] make a decision regarding which it didn't to begin with otherwise she knows to call for any problems that may develop before the next visit.  Canesha Tesfaye C    06/29/2012

## 2012-07-03 ENCOUNTER — Telehealth: Payer: Self-pay | Admitting: Oncology

## 2012-07-03 NOTE — Telephone Encounter (Signed)
S/w pt re appt for Dr. Frederich Cha @ 2:30pm pn 4/11 (arrive 2pm) and bone density test @ [facility] 07/25/12 @ 3pm. Pt given May schedule prior to leaving 3/28.

## 2012-07-08 ENCOUNTER — Other Ambulatory Visit: Payer: Self-pay | Admitting: Oncology

## 2012-07-12 ENCOUNTER — Encounter: Payer: Self-pay | Admitting: Radiation Oncology

## 2012-07-12 ENCOUNTER — Encounter: Payer: Self-pay | Admitting: *Deleted

## 2012-07-12 NOTE — Progress Notes (Signed)
Location of Breast Cancer: Left 11 o'clock   Histology per Pathology Report: bx 05/08/12 left breast Invasive Ductal  Carcinoma,Stage I ,grade II  Receptor Status: ER(+), PR (+), Her2-neu (-)  Did patient present with symptoms (if so, please note symptoms) or was this found on screening mammography?: found on a mammogram  Past/Anticipated interventions by surgeon, if any: no  Past/Anticipated interventions by medical oncology, if any: no  Lymphedema issues, if any:  no  Pain issues, if any:  HRT  7 years,married, GxP2,menarche age 61,1st live birth age 72  SAFETY ISSUES:  Prior radiation? NO  Pacemaker/ICD? No  Possible current pregnancy? no  Is the patient on methotrexate? no  Current Complaints / other details: breast  oncotype score =14Please see the Nurse Progress Note in the MD Initial Consult Encounter for this patient.

## 2012-07-12 NOTE — Progress Notes (Signed)
Location of Breast Cancer: left  Histology per Pathology Report:Tumor #1- invasive ductal carcinoma, DCIS, Tumor #2 -invasive ductal ca, DCIS  Receptor Status: ER(+), PR (+), Her2-neu (-)  Did patient present with symptoms (if so, please note symptoms) or was this found on screening mammography?: screening  Past/Anticipated interventions by surgeon, if any: lumpectomy, SNB x 2   Past/Anticipated interventions by medical oncology, if any: anti-estrogen  Lymphedema issues, if any:  none  Pain issues, if any:    SAFETY ISSUES:  Prior radiation? no  Pacemaker/ICD? no  Possible current pregnancy? no  Is the patient on methotrexate? no  Current Complaints / other details:  Part time preschool teacher, married, 2 daughters, 4 grandchildren

## 2012-07-13 ENCOUNTER — Ambulatory Visit
Admission: RE | Admit: 2012-07-13 | Discharge: 2012-07-13 | Disposition: A | Discharge status: Home / Self Care | Payer: Medicare Other | Source: Ambulatory Visit | Attending: Radiation Oncology | Admitting: Radiation Oncology

## 2012-07-13 ENCOUNTER — Encounter: Payer: Self-pay | Admitting: Radiation Oncology

## 2012-07-13 VITALS — BP 149/83 | HR 60 | Temp 98.2°F | Ht 64.5 in | Wt 173.5 lb

## 2012-07-13 DIAGNOSIS — C50212 Malignant neoplasm of upper-inner quadrant of left female breast: Secondary | ICD-10-CM

## 2012-07-13 DIAGNOSIS — Z79899 Other long term (current) drug therapy: Secondary | ICD-10-CM | POA: Insufficient documentation

## 2012-07-13 DIAGNOSIS — Z170 Estrogen receptor positive status [ER+]: Secondary | ICD-10-CM | POA: Insufficient documentation

## 2012-07-13 DIAGNOSIS — C50919 Malignant neoplasm of unspecified site of unspecified female breast: Secondary | ICD-10-CM | POA: Insufficient documentation

## 2012-07-13 NOTE — Progress Notes (Signed)
Ms. Jill Austin here for consult regarding left breast cancer.  She does have occasional pain in her left breast from the lumpectomy that she rates at a 4/10.  She denies fatigue and nausea.

## 2012-07-13 NOTE — Progress Notes (Signed)
Please see the Nurse Progress Note in the MD Initial Consult Encounter for this patient. 

## 2012-07-16 NOTE — Progress Notes (Signed)
   Department of Radiation Oncology  Phone:  [phone] Fax:        [phone]   Name: Jill Austin MRN: [id number]  DOB: 12-Dec-1945  Date: 07/16/2012  Follow Up Visit Note  Diagnosis: multifocal invasive ductal carcinoma of the left breast  Interval History: Jill Austin presents today for routine followup.  She had her lumpectomy and sentinel lymph node biopsy on March 4. This showed 2 areas of malignancy including a 1.8 cm grade 3 invasive ductal carcinoma with lymphovascular invasion as well as a 0.6 cm invasive ductal carcinoma which was also grade 3. Margins were negative. One lymph node was negative for malignancy. ER/PR were positive HER-2 was negative. The closest margin was 0.3 cm. An Oncotype was sent with a low recurrence score. She has discussed this with medical oncology will not be receiving forward with chemotherapy. She is healed up well since her surgery. She is accompanied by her husband today. She continues to have occasional pain in her left breast. She denies any other symptoms.  Allergies:  Allergies  Allergen Reactions  . Penicillins Other (See Comments)    headache  . Vicodin (Hydrocodone-Acetaminophen) Other (See Comments)    Hyper     Medications:  Current Outpatient Prescriptions  Medication Sig Dispense Refill  . amLODipine (NORVASC) 5 MG tablet 5 mg daily.       . bisoprolol-hydrochlorothiazide (ZIAC) 5-6.25 MG per tablet 1 tablet daily.       . Calcium Carbonate-Vitamin D (CALCIUM 600 + D PO) Take 600 mg by mouth daily.      . fish oil-omega-3 fatty acids 1000 MG capsule Take 2 g by mouth daily.      Marland Kitchen glucosamine-chondroitin 500-400 MG tablet Take 1 tablet by mouth 3 (three) times daily.      . meloxicam (MOBIC) 15 MG tablet 15 mg daily.       . Multiple Vitamin (MULTIVITAMIN) capsule Take 1 capsule by mouth daily.      Marland Kitchen omeprazole (PRILOSEC) 20 MG capsule       . sertraline (ZOLOFT) 100 MG tablet 100 mg daily.       Marland Kitchen HYDROmorphone (DILAUDID) 2  MG tablet Take 1 tablet (2 mg total) by mouth every 4 (four) hours as needed for pain. Take as needed for pain  30 tablet  0   No current facility-administered medications for this encounter.    Physical Exam:  Filed Vitals:   07/13/12 1510  BP: 149/83  Pulse: 60  Temp: 98.2 F (36.8 C)   she is a pleasant female in no distress sitting comfortably examining table. She has large breasts bilaterally.  IMPRESSION: Theodore is a 67 y.o. female status post lumpectomy for a multifocal T1 C. N0 invasive ductal carcinoma which is ER/PR positive  PLAN:  We discussed the role of radiation and decreasing local failures in patients who undergo lumpectomy. We discussed the process of simulation the placement tattoos. We discussed 6 weeks of treatment as an outpatient. We discussed possible prone breast treatment 2 to her large breasts. We discussed possible breath hold for cardiac sparing. We discussed skin redness irritation and breakdown. She has signed informed consent and agree to proceed forward. We were able to schedule her for simulation next week.    Lurline Hare, MD

## 2012-07-17 ENCOUNTER — Ambulatory Visit
Admission: RE | Admit: 2012-07-17 | Discharge: 2012-07-17 | Disposition: A | Discharge status: Home / Self Care | Payer: Medicare Other | Source: Ambulatory Visit | Attending: Family Medicine | Admitting: Family Medicine

## 2012-07-17 ENCOUNTER — Other Ambulatory Visit: Payer: Self-pay | Admitting: Family Medicine

## 2012-07-17 DIAGNOSIS — W19XXXA Unspecified fall, initial encounter: Secondary | ICD-10-CM

## 2012-07-18 ENCOUNTER — Ambulatory Visit: Payer: Medicare Other | Admitting: Radiation Oncology

## 2012-07-24 ENCOUNTER — Ambulatory Visit
Admission: RE | Admit: 2012-07-24 | Discharge: 2012-07-24 | Disposition: A | Discharge status: Home / Self Care | Payer: Medicare Other | Source: Ambulatory Visit | Attending: Radiation Oncology | Admitting: Radiation Oncology

## 2012-07-24 DIAGNOSIS — C50219 Malignant neoplasm of upper-inner quadrant of unspecified female breast: Secondary | ICD-10-CM | POA: Insufficient documentation

## 2012-07-24 DIAGNOSIS — Z510 Encounter for antineoplastic radiation therapy: Secondary | ICD-10-CM | POA: Insufficient documentation

## 2012-07-24 DIAGNOSIS — Y842 Radiological procedure and radiotherapy as the cause of abnormal reaction of the patient, or of later complication, without mention of misadventure at the time of the procedure: Secondary | ICD-10-CM | POA: Insufficient documentation

## 2012-07-24 DIAGNOSIS — C50212 Malignant neoplasm of upper-inner quadrant of left female breast: Secondary | ICD-10-CM

## 2012-07-24 DIAGNOSIS — Z79899 Other long term (current) drug therapy: Secondary | ICD-10-CM | POA: Insufficient documentation

## 2012-07-24 DIAGNOSIS — L988 Other specified disorders of the skin and subcutaneous tissue: Secondary | ICD-10-CM | POA: Insufficient documentation

## 2012-07-24 NOTE — Progress Notes (Signed)
Name: Jill Austin   MRN: [id number]  Date:  07/24/2012  DOB: 06/10/1945  Status:outpatient    DIAGNOSIS: Breast cancer.  CONSENT VERIFIED: yes   SET UP: Patient is setup supine   IMMOBILIZATION:  The following immobilization was used:Custom Moldable Pillow, breast board.   NARRATIVE: [name] was brought to the CT Simulation planning suite.  Identity was confirmed.  All relevant records and images related to the planned course of therapy were reviewed.  Then, the patient was positioned in a stable reproducible clinical set-up for radiation therapy.  Wires were placed to delineate the clinical extent of breast tissue. A wire was placed on the scar as well.  CT images were obtained.  An isocenter was placed. Skin markings were placed.  The CT images were loaded into the planning software where the target and avoidance structures were contoured.  The radiation prescription was entered and confirmed. The patient was discharged in stable condition and tolerated simulation well.    TREATMENT PLANNING NOTE:  Treatment planning then occurred. I have requested : MLC's, isodose plan, basic dose calculation  I personally designed and supervised the construction of 3 medically necessary complex treatment devices for the protection of critical normal structures including the lungs and contralateral breast as well as the immobilization device which is necessary for set up certainty.

## 2012-07-26 ENCOUNTER — Encounter: Payer: Self-pay | Admitting: *Deleted

## 2012-07-26 NOTE — Progress Notes (Signed)
Mailed after appt letter to pt. 

## 2012-07-31 ENCOUNTER — Ambulatory Visit: Admission: RE | Admit: 2012-07-31 | Payer: Medicare Other | Source: Ambulatory Visit | Admitting: Radiation Oncology

## 2012-08-01 ENCOUNTER — Ambulatory Visit: Payer: Medicare Other

## 2012-08-02 ENCOUNTER — Ambulatory Visit: Payer: Medicare Other | Admitting: Radiation Oncology

## 2012-08-02 ENCOUNTER — Ambulatory Visit: Payer: Medicare Other

## 2012-08-03 ENCOUNTER — Ambulatory Visit: Payer: Medicare Other

## 2012-08-06 ENCOUNTER — Ambulatory Visit: Payer: Medicare Other

## 2012-08-06 ENCOUNTER — Ambulatory Visit
Admission: RE | Admit: 2012-08-06 | Discharge: 2012-08-06 | Disposition: A | Discharge status: Home / Self Care | Payer: Medicare Other | Source: Ambulatory Visit | Attending: Radiation Oncology | Admitting: Radiation Oncology

## 2012-08-06 ENCOUNTER — Encounter: Payer: Self-pay | Admitting: Radiation Oncology

## 2012-08-07 ENCOUNTER — Ambulatory Visit
Admission: RE | Admit: 2012-08-07 | Discharge: 2012-08-07 | Disposition: A | Discharge status: Home / Self Care | Payer: Medicare Other | Source: Ambulatory Visit | Attending: Radiation Oncology | Admitting: Radiation Oncology

## 2012-08-07 ENCOUNTER — Encounter: Payer: Self-pay | Admitting: Radiation Oncology

## 2012-08-07 VITALS — BP 129/69 | HR 61 | Temp 98.5°F | Resp 20 | Wt 174.1 lb

## 2012-08-07 DIAGNOSIS — C50212 Malignant neoplasm of upper-inner quadrant of left female breast: Secondary | ICD-10-CM

## 2012-08-07 MED ORDER — RADIAPLEXRX EX GEL
Freq: Once | CUTANEOUS | Status: AC
  Administered 2012-08-07: 14:00:00 via TOPICAL

## 2012-08-07 MED ORDER — ALRA NON-METALLIC DEODORANT (RAD-ONC)
1.0000 "application " | Freq: Once | TOPICAL | Status: AC
  Administered 2012-08-07: 1 via TOPICAL

## 2012-08-07 NOTE — Progress Notes (Signed)
Weekly Management Note Current Dose: 3.6  Gy  Projected Dose: 45 Gy   Narrative:  The patient presents for routine under treatment assessment.  CBCT/MVCT images/Port film x-rays were reviewed.  The chart was checked. Doing well. No complaints. RN education performed.  Physical Findings: Weight: 174 lb 1.6 oz (78.971 kg). Unchanged  Impression:  The patient is tolerating radiation.  Plan:  Continue treatment as planned.

## 2012-08-07 NOTE — Progress Notes (Signed)
Weekly rad txs, 2 rad txs done left breast, alert,oriented x3, steady gait, no c/o pain, pt education done, radiaplex gel, alra deodorant, my buusiness card, fler on skin products, given to pt along with schedule , discusses use of radiaplex and alra, when to use, sees MD weekly/prn, can call for any concerns/questions, teach back done, eating well, drinks plenty fluids 2:18 PM

## 2012-08-08 ENCOUNTER — Ambulatory Visit
Admission: RE | Admit: 2012-08-08 | Discharge: 2012-08-08 | Disposition: A | Discharge status: Home / Self Care | Payer: Medicare Other | Source: Ambulatory Visit | Attending: Radiation Oncology | Admitting: Radiation Oncology

## 2012-08-09 ENCOUNTER — Ambulatory Visit
Admission: RE | Admit: 2012-08-09 | Discharge: 2012-08-09 | Disposition: A | Discharge status: Home / Self Care | Payer: Medicare Other | Source: Ambulatory Visit | Attending: Radiation Oncology | Admitting: Radiation Oncology

## 2012-08-10 ENCOUNTER — Ambulatory Visit
Admission: RE | Admit: 2012-08-10 | Discharge: 2012-08-10 | Disposition: A | Discharge status: Home / Self Care | Payer: Medicare Other | Source: Ambulatory Visit | Attending: Radiation Oncology | Admitting: Radiation Oncology

## 2012-08-13 ENCOUNTER — Ambulatory Visit
Admission: RE | Admit: 2012-08-13 | Discharge: 2012-08-13 | Disposition: A | Discharge status: Home / Self Care | Payer: Medicare Other | Source: Ambulatory Visit | Attending: Radiation Oncology | Admitting: Radiation Oncology

## 2012-08-14 ENCOUNTER — Encounter: Payer: Self-pay | Admitting: Radiation Oncology

## 2012-08-14 ENCOUNTER — Ambulatory Visit: Payer: Medicare Other | Admitting: Radiation Oncology

## 2012-08-14 ENCOUNTER — Ambulatory Visit
Admission: RE | Admit: 2012-08-14 | Discharge: 2012-08-14 | Disposition: A | Discharge status: Home / Self Care | Payer: Medicare Other | Source: Ambulatory Visit | Attending: Radiation Oncology | Admitting: Radiation Oncology

## 2012-08-14 VITALS — BP 132/77 | HR 68 | Temp 97.8°F | Resp 20 | Wt 174.4 lb

## 2012-08-14 DIAGNOSIS — C50212 Malignant neoplasm of upper-inner quadrant of left female breast: Secondary | ICD-10-CM

## 2012-08-14 NOTE — Progress Notes (Signed)
Weekly Management Note Current Dose:  12.6 Gy  Projected Dose: 45 Gy   Narrative:  The patient presents for routine under treatment assessment.  CBCT/MVCT images/Port film x-rays were reviewed.  The chart was checked. Doing well. No complaints No fatigue.   Physical Findings: Weight: 174 lb 6.4 oz (79.107 kg). Unchanged  Impression:  The patient is tolerating radiation.  Plan:  Continue treatment as planned. Continue radiaplex.

## 2012-08-14 NOTE — Progress Notes (Signed)
Weekly rad tx left breast, alert,oriented x3, very slight pink on skin, only c/o nipple tender since surgery, uses radiaplex gel bid, appetite good, no fatigue,feels great, not sleeping well at night, 2:00 PM

## 2012-08-15 ENCOUNTER — Ambulatory Visit
Admission: RE | Admit: 2012-08-15 | Discharge: 2012-08-15 | Disposition: A | Discharge status: Home / Self Care | Payer: Medicare Other | Source: Ambulatory Visit | Attending: Radiation Oncology | Admitting: Radiation Oncology

## 2012-08-16 ENCOUNTER — Ambulatory Visit
Admission: RE | Admit: 2012-08-16 | Discharge: 2012-08-16 | Disposition: A | Discharge status: Home / Self Care | Payer: Medicare Other | Source: Ambulatory Visit | Attending: Radiation Oncology | Admitting: Radiation Oncology

## 2012-08-17 ENCOUNTER — Ambulatory Visit
Admission: RE | Admit: 2012-08-17 | Discharge: 2012-08-17 | Disposition: A | Discharge status: Home / Self Care | Payer: Medicare Other | Source: Ambulatory Visit | Attending: Radiation Oncology | Admitting: Radiation Oncology

## 2012-08-20 ENCOUNTER — Ambulatory Visit
Admission: RE | Admit: 2012-08-20 | Discharge: 2012-08-20 | Disposition: A | Discharge status: Home / Self Care | Payer: Medicare Other | Source: Ambulatory Visit | Attending: Radiation Oncology | Admitting: Radiation Oncology

## 2012-08-21 ENCOUNTER — Ambulatory Visit
Admission: RE | Admit: 2012-08-21 | Discharge: 2012-08-21 | Disposition: A | Discharge status: Home / Self Care | Payer: Medicare Other | Source: Ambulatory Visit | Attending: Radiation Oncology | Admitting: Radiation Oncology

## 2012-08-22 ENCOUNTER — Ambulatory Visit
Admission: RE | Admit: 2012-08-22 | Discharge: 2012-08-22 | Disposition: A | Discharge status: Home / Self Care | Payer: Medicare Other | Source: Ambulatory Visit | Attending: Radiation Oncology | Admitting: Radiation Oncology

## 2012-08-23 ENCOUNTER — Ambulatory Visit (HOSPITAL_BASED_OUTPATIENT_CLINIC_OR_DEPARTMENT_OTHER): Payer: Medicare Other | Admitting: Oncology

## 2012-08-23 ENCOUNTER — Ambulatory Visit
Admission: RE | Admit: 2012-08-23 | Discharge: 2012-08-23 | Disposition: A | Discharge status: Home / Self Care | Payer: Medicare Other | Source: Ambulatory Visit | Attending: Radiation Oncology | Admitting: Radiation Oncology

## 2012-08-23 ENCOUNTER — Telehealth: Payer: Self-pay | Admitting: *Deleted

## 2012-08-23 VITALS — BP 137/80 | HR 69 | Temp 98.4°F | Resp 20 | Ht 64.0 in | Wt 172.9 lb

## 2012-08-23 DIAGNOSIS — C50219 Malignant neoplasm of upper-inner quadrant of unspecified female breast: Secondary | ICD-10-CM

## 2012-08-23 DIAGNOSIS — F411 Generalized anxiety disorder: Secondary | ICD-10-CM

## 2012-08-23 DIAGNOSIS — G8929 Other chronic pain: Secondary | ICD-10-CM

## 2012-08-23 DIAGNOSIS — M8580 Other specified disorders of bone density and structure, unspecified site: Secondary | ICD-10-CM

## 2012-08-23 DIAGNOSIS — M549 Dorsalgia, unspecified: Secondary | ICD-10-CM

## 2012-08-23 NOTE — Progress Notes (Signed)
ID: Jill Austin   DOB: 1945/04/16  MR#: [id number]  WUJ#:[id number]  PCP: [name], MD GYN: Meredeth Ide SU: Cicero Duck OTHER MD: Lurline Hare, Jeralyn Ruths, Christine McCuen   HISTORY OF PRESENT ILLNESS: Jill Austin had routine screening mammography at [facility] 05/05/2011, showing heterogeneously dense breasts. There were no findings of concern. On 05/07/2012, a new spiculated mass was noted in the left breast, and the patient was recalled for left breast ultrasound 05/08/2012. This confirmed a hypoechoic mass measuring 1.3 cm, at the 11:00 position 11 cm from the nipple. The left axilla was unremarkable.  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  The patient's subsequent history is as detailed below.  INTERVAL HISTORY: Jill Austin returns today for followup of her breast cancer. Since her last visit here she has started her radiation treatments. So far she is tolerating them well.   REVIEW OF SYSTEMS: She is feeling anxious and this is upsetting her sleep pattern. She's not actually anxious about the cancer at about all the appointment she has. She's used it just "doing what I like when I like", and now she is afraid she is going to miss an appointment here or there. She has a very irregular sleep pattern, sleeps late during the day and stays up late at night, which also may be contributing. In addition she has chronic low back pain. This "flared up" about a week ago, but has now "gone back down". At today's visit she denies any back pain whatsoever. A detailed review of systems was otherwise noncontributory  PAST MEDICAL HISTORY: Past Medical History  Diagnosis Date  . Breast cancer 05/08/12    invasive mammary ca, ER/PR+, HER 2 -  . Hypertension   . Arthritis   . GERD (gastroesophageal  reflux disease)   . Wears glasses   . PONV (postoperative nausea and vomiting)    tinnitus and hearing loss left year; history of remote panic attacks; GERD; wrinkle over the left retina  PAST SURGICAL HISTORY: Past Surgical History  Procedure Laterality Date  . Dilation and curettage of uterus    . Hand osteoplasty  2007    left [facility]  . Colonoscopy    . Breast lumpectomy with needle localization and axillary sentinel lymph node bx Left 06/05/2012    Procedure: BREAST LUMPECTOMY WITH NEEDLE LOCALIZATION AND AXILLARY SENTINEL LYMPH NODE BX;  Surgeon: Currie Paris, MD;  Location: [facility];  Service: General;  Laterality: Left;  . Polypectomy      uterine   Hand surgery under Jill Austin Removal of uterine polyp  FAMILY HISTORY No family history on file. The patient's father died at the age of 85 from heart problems. The patient's mother died at the age of 27, from heart problems. Jill Austin had one brother, no sisters. There is no history of breast or ovarian cancer in the family to her knowledge.  GYNECOLOGIC HISTORY: Menarche age 55, first live birth age 66, she is GX P2, last menstrual period approximately 1998. She took hormone replacement for approximately 7 years.  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 Her husband Jill Austin (goes by Jill Austin is retired from [employer]. He is having significant spine problems. Their daughter Jill Austin teaches 4-year-olds in [employer] in [place]; daughter Jill Austin lives in [place] and  is a homemaker. The patient has 4 grandchildren. She attends the [employer]  ADVANCED DIRECTIVES: In place  HEALTH MAINTENANCE: History  Substance Use Topics  . Smoking status: Never Smoker   . Smokeless tobacco: Not on file  . Alcohol Use: 6.0 oz/week    10 Glasses of wine per week     Colonoscopy: 2002  PAP: 2011  Bone density: 07/25/2012 SOLIS: T -  1.8  Lipid panel:  Allergies  Allergen Reactions  . Penicillins Other (See Comments)    headache  . Vicodin (Hydrocodone-Acetaminophen) Other (See Comments)    Hyper     Current Outpatient Prescriptions  Medication Sig Dispense Refill  . amLODipine (NORVASC) 5 MG tablet 5 mg daily.       . bisoprolol-hydrochlorothiazide (ZIAC) 5-6.25 MG per tablet 1 tablet daily.       . Calcium Carbonate-Vitamin D (CALCIUM 600 + D PO) Take 600 mg by mouth daily.      . fish oil-omega-3 fatty acids 1000 MG capsule Take 2 g by mouth daily.      Marland Kitchen glucosamine-chondroitin 500-400 MG tablet Take 1 tablet by mouth 3 (three) times daily.      . hyaluronate sodium (RADIAPLEXRX) GEL Apply 1 application topically 2 (two) times daily. Apply to left breast after rad tx and bedtime, not 4 hours prior to rad txs      . HYDROmorphone (DILAUDID) 2 MG tablet Take 1 tablet (2 mg total) by mouth every 4 (four) hours as needed for pain. Take as needed for pain  30 tablet  0  . meloxicam (MOBIC) 15 MG tablet 15 mg daily.       . Multiple Vitamin (MULTIVITAMIN) capsule Take 1 capsule by mouth daily.      . non-metallic deodorant Jill Austin) MISC Apply 1 application topically daily. Apply after rad tx/prn daily      . omeprazole (PRILOSEC) 20 MG capsule       . sertraline (ZOLOFT) 100 MG tablet 100 mg daily.        No current facility-administered medications for this visit.    OBJECTIVE: Middle-aged white woman who looks well Filed Vitals:   08/23/12 1430  BP: 137/80  Pulse: 69  Temp: 98.4 F (36.9 C)  Resp: 20     Body mass index is 29.66 kg/(m^2).    ECOG FS: 0  Sclerae unicteric Oropharynx clear No cervical or supraclavicular adenopathy Lungs no rales or rhonchi Heart regular rate and rhythm Abd benign MSK no focal spinal tenderness to thorough percussion, no peripheral edema Neuro: nonfocal, well oriented, pleasant affect Breasts: The right breast is unremarkable. The left breast is status post  lumpectomy  and is currently undergoing radiation. There is no erythema or desquamation. The left axilla is benign.   LAB RESULTS: Lab Results  Component Value Date   WBC 6.1 05/16/2012   NEUTROABS 3.6 05/16/2012   HGB 14.0 06/05/2012   HCT 39.5 05/16/2012   MCV 89.0 05/16/2012   PLT 180 05/16/2012      Chemistry      Component Value Date/Time   NA 139 06/04/2012 1200   NA 139 05/16/2012 0849   K 3.8 06/04/2012 1200   K 4.3 05/16/2012 0849   CL 103 06/04/2012 1200   CL 103 05/16/2012 0849   CO2 26 06/04/2012 1200   CO2 29 05/16/2012 0849   BUN 22 06/04/2012 1200   BUN 35.9* 05/16/2012 0849   CREATININE 0.79 06/04/2012 1200   CREATININE  0.9 05/16/2012 0849      Component Value Date/Time   CALCIUM 9.2 06/04/2012 1200   CALCIUM 9.8 05/16/2012 0849   ALKPHOS 87 05/16/2012 0849   AST 16 05/16/2012 0849   ALT 12 05/16/2012 0849   BILITOT 1.11 05/16/2012 0849       Lab Results  Component Value Date   LABCA2 24 05/16/2012    No components found with this basename: LABCA125    No results found for this basename: INR,  in the last 168 hours  Urinalysis No results found for this basename: colorurine,  appearanceur,  labspec,  phurine,  glucoseu,  hgbur,  bilirubinur,  ketonesur,  proteinur,  urobilinogen,  nitrite,  leukocytesur    STUDIES: No results found.  ASSESSMENT: 67 y.o. [place] woman status post left lumpectomy and sentinel lymph node sampling 06/05/2012 for a mpT1c pN0, stage IA invasive ductal carcinoma, grade 3, estrogen and progesterone receptor positive both at 100%, HER-2/neu negative, with an MIB-1 of 12%.  (1) Oncotype DX showed a score of 14, predicting a distant recurrence risk of 9% within the next 10 years if the patient's only systemic treatment is tamoxifen for 5 years  (2) adjuvant radiation to be completed 09/19/2012  (7) Jasmine December to start anastrozole July 2014  PLAN: We discussed the difference between aromatase inhibitors and tamoxifen in detail, and she has a good understanding  of the possible toxicities, side effects, and complications: All this information was given to her in writing. After much discussion we decided as soon as she recovers from her radiation, sometime early July, she will start anastrozole. We will see her again in October primarily to make sure that she is tolerating the anastrozole well, and to check a vitamin D level. If she is doing well with that medication the plan will be to continue with at least for 2 years and at that point we would repeat a bone density. If the bone density shows a significant change we would consider switching to tamoxifen that. Otherwise she will likely continue the anastrozole for total of 5 years.  Jerlean has a good understanding of this plan. She knows to call for any problems that may develop before the next visit here. Ronia Hazelett C    08/23/2012

## 2012-08-23 NOTE — Telephone Encounter (Signed)
appts made and printed...td 

## 2012-08-23 NOTE — Addendum Note (Signed)
Addended by: Billey Co on: 08/23/2012 05:44 PM   Modules accepted: Orders

## 2012-08-24 ENCOUNTER — Ambulatory Visit
Admission: RE | Admit: 2012-08-24 | Discharge: 2012-08-24 | Disposition: A | Discharge status: Home / Self Care | Payer: Medicare Other | Source: Ambulatory Visit | Attending: Radiation Oncology | Admitting: Radiation Oncology

## 2012-08-24 ENCOUNTER — Encounter: Payer: Self-pay | Admitting: Radiation Oncology

## 2012-08-24 VITALS — BP 142/85 | HR 60 | Temp 98.0°F | Resp 20 | Wt 172.9 lb

## 2012-08-24 DIAGNOSIS — C50212 Malignant neoplasm of upper-inner quadrant of left female breast: Secondary | ICD-10-CM

## 2012-08-24 NOTE — Progress Notes (Signed)
Weekly rad txs  15 lt breast completed,  No c/o pain, slight erythema, using radiaplex bid 2:01 PM

## 2012-08-24 NOTE — Progress Notes (Signed)
Weekly Management Note Current Dose: 27 Gy  Projected Dose: 45 Gy   Narrative:  The patient presents for routine under treatment assessment.  CBCT/MVCT images/Port film x-rays were reviewed.  The chart was checked. Doing well. Minimal skin changes.  Physical Findings: Weight: 172 lb 14.4 oz (78.427 kg). Pink left breast.   Impression:  The patient is tolerating radiation.  Plan:  Continue treatment as planned. Continue radiaplex.

## 2012-08-28 ENCOUNTER — Ambulatory Visit
Admission: RE | Admit: 2012-08-28 | Discharge: 2012-08-28 | Disposition: A | Discharge status: Home / Self Care | Payer: Medicare Other | Source: Ambulatory Visit | Attending: Radiation Oncology | Admitting: Radiation Oncology

## 2012-08-28 ENCOUNTER — Encounter: Payer: Self-pay | Admitting: Radiation Oncology

## 2012-08-28 VITALS — BP 134/100 | HR 61 | Temp 98.1°F | Resp 20 | Wt 173.3 lb

## 2012-08-28 DIAGNOSIS — C50212 Malignant neoplasm of upper-inner quadrant of left female breast: Secondary | ICD-10-CM

## 2012-08-28 NOTE — Progress Notes (Signed)
Weekly Management Note Current Dose:  28.8 Gy  Projected Dose: 45 Gy   Narrative:  The patient presents for routine under treatment assessment.  CBCT/MVCT images/Port film x-rays were reviewed.  The chart was checked. Aunt died and funeral tomorrow. No breast pain.   Physical Findings: Weight: 173 lb 4.8 oz (78.608 kg). Unchanged. Slightly pink.   Impression:  The patient is tolerating radiation.  Plan:  Continue treatment as planned. Continue radiaplex.

## 2012-08-28 NOTE — Progress Notes (Addendum)
Patient here weekly rad txs  lt breast, 16 completed, very minute pink on breast, nipple "alittle hard and tender" stated patient, loss of favorite aunt Saturday, will be going to her funeral tomorrow, using radiaplex gel as directed 2:13 PM

## 2012-08-30 ENCOUNTER — Ambulatory Visit
Admission: RE | Admit: 2012-08-30 | Discharge: 2012-08-30 | Disposition: A | Discharge status: Home / Self Care | Payer: Medicare Other | Source: Ambulatory Visit | Attending: Radiation Oncology | Admitting: Radiation Oncology

## 2012-08-31 ENCOUNTER — Ambulatory Visit
Admission: RE | Admit: 2012-08-31 | Discharge: 2012-08-31 | Disposition: A | Discharge status: Home / Self Care | Payer: Medicare Other | Source: Ambulatory Visit | Attending: Radiation Oncology | Admitting: Radiation Oncology

## 2012-09-03 ENCOUNTER — Ambulatory Visit
Admission: RE | Admit: 2012-09-03 | Discharge: 2012-09-03 | Disposition: A | Discharge status: Home / Self Care | Payer: Medicare Other | Source: Ambulatory Visit | Attending: Radiation Oncology | Admitting: Radiation Oncology

## 2012-09-04 ENCOUNTER — Encounter: Payer: Self-pay | Admitting: Radiation Oncology

## 2012-09-04 ENCOUNTER — Ambulatory Visit
Admission: RE | Admit: 2012-09-04 | Discharge: 2012-09-04 | Disposition: A | Discharge status: Home / Self Care | Payer: Medicare Other | Source: Ambulatory Visit | Attending: Radiation Oncology | Admitting: Radiation Oncology

## 2012-09-04 VITALS — BP 123/63 | HR 67 | Temp 98.7°F | Resp 20 | Wt 172.4 lb

## 2012-09-04 DIAGNOSIS — C50211 Malignant neoplasm of upper-inner quadrant of right female breast: Secondary | ICD-10-CM

## 2012-09-04 MED ORDER — RADIAPLEXRX EX GEL
Freq: Once | CUTANEOUS | Status: AC
  Administered 2012-09-04: 14:00:00 via TOPICAL

## 2012-09-04 NOTE — Progress Notes (Signed)
Nystatin powder given along with telfa pads with instructions on use and 2nd tube radiaplex given 2:24 PM

## 2012-09-04 NOTE — Progress Notes (Signed)
Weekly Management Note Current Dose: 36  Gy  Projected Dose: 45 Gy   Narrative:  The patient presents for routine under treatment assessment.  CBCT/MVCT images/Port film x-rays were reviewed.  The chart was checked. Doing well. Hip pain after chiropractor. Some itching in inframammary fold.  Physical Findings: Weight: 172 lb 6.4 oz (78.2 kg). Unchanged. Small fungal plaques in inframammary fold.   Impression:  The patient is tolerating radiation.  Plan:  Continue treatment as planned. Nystatin powder. Continue radiaplex.

## 2012-09-04 NOTE — Progress Notes (Signed)
Weekly rad  txs left breast  20/25 completed, very slight pink,skin intact, under inframmary fld, rash like, ,c/o itching there, not as bad as say night, patient c/o  Low back/left hip pain, went to chiropractor, now feels pain is worse, has cane borrowed from a friend, supposed to go back today states patient 2:01 PM

## 2012-09-04 NOTE — Progress Notes (Signed)
Name: Jill Austin   MRN: [id number]  Date:  09/04/2012   DOB: 30-Oct-1945  Status:outpatient    DIAGNOSIS: Breast cancer.  CONSENT VERIFIED: yes   SET UP: Patient is setup supine   IMMOBILIZATION:  The following immobilization was used:Custom Moldable Pillow, breast board.   NARRATIVE: [name] underwent complex simulation and treatment planning for her boost treatment today.  Her tumor volume was outlined on the planning CT scan. The depth of her cavity was 2.34 Cm.    9  MeV electrons will be prescribed to the 95% Isodose line.   A block will be used for beam modification purposes.  A special port plan is requested.

## 2012-09-05 ENCOUNTER — Ambulatory Visit: Payer: Medicare Other

## 2012-09-05 ENCOUNTER — Telehealth: Payer: Self-pay | Admitting: *Deleted

## 2012-09-05 NOTE — Telephone Encounter (Signed)
Called again and now spoke with  Jill Austin, she has been in bed all day, has taken some muscle relaxers, and putting ice and heat on her back/hip area, she did go to the chiropractor yesterday and had a slight adjustment, but stated she would not be going back there any more, she will try her best to come in tomorrow, she feels a little better this afternoon, informed her we have w/c in front lobby to help her come down to radiation, safety precautions and to save her from walking , she said  thank you for calling and checking on her 1:27 PM

## 2012-09-06 ENCOUNTER — Ambulatory Visit
Admission: RE | Admit: 2012-09-06 | Discharge: 2012-09-06 | Disposition: A | Discharge status: Home / Self Care | Payer: Medicare Other | Source: Ambulatory Visit | Attending: Radiation Oncology | Admitting: Radiation Oncology

## 2012-09-07 ENCOUNTER — Ambulatory Visit: Payer: Medicare Other

## 2012-09-07 ENCOUNTER — Ambulatory Visit
Admission: RE | Admit: 2012-09-07 | Discharge: 2012-09-07 | Disposition: A | Discharge status: Home / Self Care | Payer: Medicare Other | Source: Ambulatory Visit | Attending: Radiation Oncology | Admitting: Radiation Oncology

## 2012-09-08 ENCOUNTER — Ambulatory Visit: Payer: Medicare Other

## 2012-09-10 ENCOUNTER — Ambulatory Visit
Admission: RE | Admit: 2012-09-10 | Discharge: 2012-09-10 | Disposition: A | Discharge status: Home / Self Care | Payer: Medicare Other | Source: Ambulatory Visit | Attending: Radiation Oncology | Admitting: Radiation Oncology

## 2012-09-11 ENCOUNTER — Encounter: Payer: Self-pay | Admitting: Radiation Oncology

## 2012-09-11 ENCOUNTER — Ambulatory Visit
Admission: RE | Admit: 2012-09-11 | Discharge: 2012-09-11 | Disposition: A | Discharge status: Home / Self Care | Payer: Medicare Other | Source: Ambulatory Visit | Attending: Radiation Oncology | Admitting: Radiation Oncology

## 2012-09-11 VITALS — BP 125/81 | HR 60 | Temp 97.7°F | Resp 20 | Wt 173.2 lb

## 2012-09-11 DIAGNOSIS — C50212 Malignant neoplasm of upper-inner quadrant of left female breast: Secondary | ICD-10-CM

## 2012-09-11 NOTE — Progress Notes (Signed)
Pt states she continues to apply Miconazole powder under her breast for "rash that itches". She states the area had improved until she spent a day outside this past weekend and sweated. Then she noticed it had gotten worse. She is alternating Miconazole w/Radiaplex, will discuss this regime w/Dr Roselind Messier today. Pt denies fatigue, loss of appetite.

## 2012-09-11 NOTE — Progress Notes (Signed)
[facility]    Radiation Oncology [address] [place]     Maryln Gottron, M.D. [place], [place] [address]               Billie Lade, M.D., Ph.D. Phone: [phone]      Molli Hazard A. Kathrynn Running, M.D. Fax: [phone]      Radene Gunning, M.D., Ph.D.         Lurline Hare, M.D.         Grayland Jack, M.D Weekly Treatment Management Note  Name: Jill Austin     MRN: [id number]        CSN: [id number] Date: 09/11/2012      DOB: 04-Aug-1945  CC: Jill Confer, MD         Gerri Spore    Status: Outpatient  Diagnosis: The encounter diagnosis was Cancer of upper-inner quadrant of female breast, left.  Current Dose: 43.2 Gy  Current Fraction: 24  Planned Dose: 45+ Gy  Narrative: Jill Austin was seen today for weekly treatment management. The chart was checked and port films  were reviewed. She continues to have some itching and discomfort in the inframammary fold and nipple area. She has been using antifungal powder in the inframammary fold area. She alternates this with her skin cream.  Penicillins and Vicodin Current Outpatient Prescriptions  Medication Sig Dispense Refill  . amLODipine (NORVASC) 5 MG tablet 5 mg daily.       Marland Kitchen anastrozole (ARIMIDEX) 1 MG tablet Take 1 mg by mouth daily.      . bisoprolol-hydrochlorothiazide (ZIAC) 5-6.25 MG per tablet 1 tablet daily.       . Calcium Carbonate-Vitamin D (CALCIUM 600 + D PO) Take 600 mg by mouth daily.      . fish oil-omega-3 fatty acids 1000 MG capsule Take 2 g by mouth daily.      . hyaluronate sodium (RADIAPLEXRX) GEL Apply 1 application topically 2 (two) times daily. Apply to left breast after rad tx and bedtime, not 4 hours prior to rad txs      . meloxicam (MOBIC) 15 MG tablet 15 mg daily.       . Multiple Vitamin (MULTIVITAMIN) capsule Take 1 capsule by mouth daily.      . non-metallic deodorant Thornton Papas) MISC Apply 1 application topically daily. Apply after rad tx/prn daily      . omeprazole (PRILOSEC)  20 MG capsule       . sertraline (ZOLOFT) 100 MG tablet 100 mg daily.        No current facility-administered medications for this encounter.   Labs:  Lab Results  Component Value Date   WBC 6.1 05/16/2012   HGB 14.0 06/05/2012   HCT 39.5 05/16/2012   MCV 89.0 05/16/2012   PLT 180 05/16/2012   Lab Results  Component Value Date   CREATININE 0.79 06/04/2012   BUN 22 06/04/2012   NA 139 06/04/2012   K 3.8 06/04/2012   CL 103 06/04/2012   CO2 26 06/04/2012   Lab Results  Component Value Date   ALT 12 05/16/2012   AST 16 05/16/2012   BILITOT 1.11 05/16/2012    Physical Examination:  weight is 173 lb 3.2 oz (78.563 kg). Her oral temperature is 97.7 F (36.5 C). Her blood pressure is 125/81 and her pulse is 60. Her respiration is 20.    Wt Readings from Last 3 Encounters:  09/11/12 173 lb 3.2 oz (78.563 kg)  09/04/12 172 lb 6.4 oz (  78.2 kg)  08/28/12 173 lb 4.8 oz (78.608 kg)    The left breast area shows some erythema and hyperpigmentation changes. The inframammary fold area shows more significant erythema with questionable candida infection. Lungs - Normal respiratory effort, chest expands symmetrically. Lungs are clear to auscultation, no crackles or wheezes.  Heart has regular rhythm and rate  Abdomen is soft and non tender with normal bowel sounds  Assessment:  Patient tolerating treatments well except for issues as above.  Plan: Continue treatment per original radiation prescription.  I have advised patient to use antifungal powder in the inframammary fold area rather than alternating with her skin cream.  She will continue to use her skin cream elsewhere within the breast area.

## 2012-09-12 ENCOUNTER — Ambulatory Visit: Payer: Medicare Other

## 2012-09-12 ENCOUNTER — Ambulatory Visit
Admission: RE | Admit: 2012-09-12 | Discharge: 2012-09-12 | Disposition: A | Discharge status: Home / Self Care | Payer: Medicare Other | Source: Ambulatory Visit | Attending: Radiation Oncology | Admitting: Radiation Oncology

## 2012-09-13 ENCOUNTER — Ambulatory Visit
Admission: RE | Admit: 2012-09-13 | Discharge: 2012-09-13 | Disposition: A | Discharge status: Home / Self Care | Payer: Medicare Other | Source: Ambulatory Visit | Attending: Radiation Oncology | Admitting: Radiation Oncology

## 2012-09-13 ENCOUNTER — Ambulatory Visit: Payer: Medicare Other

## 2012-09-13 DIAGNOSIS — C50212 Malignant neoplasm of upper-inner quadrant of left female breast: Secondary | ICD-10-CM

## 2012-09-14 ENCOUNTER — Ambulatory Visit
Admission: RE | Admit: 2012-09-14 | Discharge: 2012-09-14 | Disposition: A | Discharge status: Home / Self Care | Payer: Medicare Other | Source: Ambulatory Visit | Attending: Radiation Oncology | Admitting: Radiation Oncology

## 2012-09-17 ENCOUNTER — Ambulatory Visit
Admission: RE | Admit: 2012-09-17 | Discharge: 2012-09-17 | Disposition: A | Discharge status: Home / Self Care | Payer: Medicare Other | Source: Ambulatory Visit | Attending: Radiation Oncology | Admitting: Radiation Oncology

## 2012-09-18 ENCOUNTER — Ambulatory Visit: Payer: Medicare Other

## 2012-09-18 ENCOUNTER — Ambulatory Visit
Admission: RE | Admit: 2012-09-18 | Discharge: 2012-09-18 | Disposition: A | Discharge status: Home / Self Care | Payer: Medicare Other | Source: Ambulatory Visit | Attending: Radiation Oncology | Admitting: Radiation Oncology

## 2012-09-18 DIAGNOSIS — C50212 Malignant neoplasm of upper-inner quadrant of left female breast: Secondary | ICD-10-CM

## 2012-09-18 NOTE — Progress Notes (Signed)
Weekly Management Note Current Dose: 53  Gy  Projected Dose: 61 Gy   Narrative:  The patient presents for routine under treatment assessment.  CBCT/MVCT images/Port film x-rays were reviewed.  The chart was checked. Doing well. Some irritation in inframammary fold. Seen on tx machine during electron set up review.   Physical Findings: dry desquamation in inframammary fold  Impression:  The patient is tolerating radiation.  Plan:  Continue treatment as planned.

## 2012-09-18 NOTE — Progress Notes (Signed)
patient seen in back treatment area by MD per Debarah Crape, therapist,not sent to nursing for assessment

## 2012-09-19 ENCOUNTER — Ambulatory Visit
Admission: RE | Admit: 2012-09-19 | Discharge: 2012-09-19 | Disposition: A | Discharge status: Home / Self Care | Payer: Medicare Other | Source: Ambulatory Visit | Attending: Radiation Oncology | Admitting: Radiation Oncology

## 2012-09-19 ENCOUNTER — Ambulatory Visit: Payer: Medicare Other

## 2012-09-20 ENCOUNTER — Ambulatory Visit
Admission: RE | Admit: 2012-09-20 | Discharge: 2012-09-20 | Disposition: A | Discharge status: Home / Self Care | Payer: Medicare Other | Source: Ambulatory Visit | Attending: Radiation Oncology | Admitting: Radiation Oncology

## 2012-09-20 ENCOUNTER — Ambulatory Visit: Payer: Medicare Other

## 2012-09-21 ENCOUNTER — Ambulatory Visit: Payer: Medicare Other

## 2012-09-21 ENCOUNTER — Encounter: Payer: Self-pay | Admitting: Radiation Oncology

## 2012-09-21 ENCOUNTER — Ambulatory Visit
Admission: RE | Admit: 2012-09-21 | Discharge: 2012-09-21 | Disposition: A | Discharge status: Home / Self Care | Payer: Medicare Other | Source: Ambulatory Visit | Attending: Radiation Oncology | Admitting: Radiation Oncology

## 2012-09-21 VITALS — BP 127/76 | HR 89 | Temp 98.2°F | Resp 20 | Wt 171.8 lb

## 2012-09-21 DIAGNOSIS — C50212 Malignant neoplasm of upper-inner quadrant of left female breast: Secondary | ICD-10-CM

## 2012-09-21 NOTE — Progress Notes (Addendum)
Weekly rad txs 32/33  Left breast, erythema and under inframmary fold, bright erythema dry desquamation, skin, skin intact though, "Hurts a lot, " takes ibuprofen prn, uses radiaplex gel 2-3x day, miconozole powder prn,  Encouraged after rad txs in 2 weeks to start lotion with vitamin E, not interested in [facility] support group 1:56 PM

## 2012-09-21 NOTE — Progress Notes (Signed)
Weekly Management Note Current Dose: 59  Gy  Projected Dose: 61 Gy   Narrative:  The patient presents for routine under treatment assessment.  CBCT/MVCT images/Port film x-rays were reviewed.  The chart was checked. Dry desquamation in inframmary fold. Using ibuprofen. Encouraged lotion with vitamin e.   Physical Findings: Weight: 171 lb 12.8 oz (77.928 kg). Dry desquamation over left breast worse in the inframammary fold.  Impression:  The patient is tolerating radiation.  Plan:  Continue treatment as planned. Continue radiaplex. Switch to lotion with vit e after 2 weeks. Call if skin gets worse. Follow up in 1 month.

## 2012-09-22 ENCOUNTER — Ambulatory Visit: Payer: Medicare Other

## 2012-09-24 ENCOUNTER — Ambulatory Visit: Payer: Medicare Other

## 2012-09-24 ENCOUNTER — Ambulatory Visit
Admission: RE | Admit: 2012-09-24 | Discharge: 2012-09-24 | Disposition: A | Discharge status: Home / Self Care | Payer: Medicare Other | Source: Ambulatory Visit | Attending: Radiation Oncology | Admitting: Radiation Oncology

## 2012-09-24 ENCOUNTER — Encounter: Payer: Self-pay | Admitting: Radiation Oncology

## 2012-09-26 NOTE — Progress Notes (Signed)
  Radiation Oncology         [phone] ________________________________  Name: Jill Austin MRN: [id number]  Date: 09/24/2012  DOB: 05/05/1945  End of Treatment Note  Diagnosis:   T1cN0 Invasive Left breast cancer  Indication for treatment:  Curative     Radiation treatment dates:   08/06/2012-09/24/2012  Site/dose:    Right breast / 45 Gray @ 1.8 Wallace Cullens per fraction x 25 fractions Right breast boost / 16 Gray at [employer] per fraction x 8 fractions  Beams/energy:  Opposed Tangents / 6 and 10 MV photons En face / 9 MeV electrons  Narrative: The patient tolerated radiation treatment well.   She had dry desquamation in her inframammary fold.   Plan: The patient has completed radiation treatment. The patient will return to radiation oncology clinic for routine followup in one month. I advised them to call or return sooner if they have any questions or concerns related to their recovery or treatment.  ------------------------------------------------  Lurline Hare, MD

## 2012-09-26 NOTE — Progress Notes (Addendum)
°  Radiation Oncology         [phone] ________________________________  Name: Jill Austin MRN: [id number]  Date: 08/06/2012  DOB: Oct 18, 1945  Simulation Verification Note  Status:  Outpatient  NARRATIVE: The patient was brought to the treatment unit and placed in the planned treatment position. The clinical setup was verified. Then port films were obtained and uploaded to the radiation oncology medical record software.  The treatment beams were carefully compared against the planned radiation fields. The position location and shape of the radiation fields was reviewed. The targeted volume of tissue appears appropriately covered by the radiation beams. Organs at risk appear to be excluded as planned.  Based on my personal review, I approved the simulation verification. The patient's treatment will proceed as planned.  ------------------------------------------------  Lurline Hare, MD

## 2012-10-31 ENCOUNTER — Encounter: Payer: Self-pay | Admitting: Radiation Oncology

## 2012-11-07 ENCOUNTER — Other Ambulatory Visit: Payer: Self-pay

## 2012-11-08 ENCOUNTER — Ambulatory Visit
Admission: RE | Admit: 2012-11-08 | Discharge: 2012-11-08 | Disposition: A | Discharge status: Home / Self Care | Payer: Medicare Other | Source: Ambulatory Visit | Attending: Radiation Oncology | Admitting: Radiation Oncology

## 2012-11-08 ENCOUNTER — Encounter: Payer: Self-pay | Admitting: Radiation Oncology

## 2012-11-08 VITALS — BP 148/87 | HR 67 | Temp 98.7°F | Resp 20 | Wt 176.4 lb

## 2012-11-08 DIAGNOSIS — C50212 Malignant neoplasm of upper-inner quadrant of left female breast: Secondary | ICD-10-CM

## 2012-11-08 HISTORY — DX: Personal history of irradiation: Z92.3

## 2012-11-08 NOTE — Progress Notes (Signed)
Follow up left breast rad txs 08/06/12-09/24/12 Left breast well healed, no skin breakdown, slight tanning subclavian area, occasional twinges and tendernes nipple, appetite good, no fatigue, good spirits, Arimidex  1mg  daily now,  Hot flashes yes stated patientno mammogram  Scheduled as yet, Amy Berry,PA appt 11/23/12,

## 2012-11-08 NOTE — Progress Notes (Signed)
   Department of Radiation Oncology  Phone:  [phone] Fax:        [phone]   Name: Jill Austin MRN: [id number]  DOB: 1945-08-15  Date: 11/08/2012  Follow Up Visit Note  Diagnosis: T1cN0 Left breast cancer  Summary and Interval since last radiation: 61 Gy completed 09/24/12  Interval History: Jill Austin presents today for routine followup.  He is healed up well. She is tolerating her Arimidex well. She still has some very minimal tanning in the medial aspect of her scar. Otherwise she is back to her normal activities. She really has no psychological effects related to cancer. She has occasional hot flashes. She is scheduled to see medical oncology later this month.  Allergies:  Allergies  Allergen Reactions  . Penicillins Other (See Comments)    headache  . Vicodin (Hydrocodone-Acetaminophen) Other (See Comments)    Hyper     Medications:  Current Outpatient Prescriptions  Medication Sig Dispense Refill  . amLODipine (NORVASC) 5 MG tablet 5 mg daily.       Marland Kitchen anastrozole (ARIMIDEX) 1 MG tablet Take 1 mg by mouth daily.      . bisoprolol-hydrochlorothiazide (ZIAC) 5-6.25 MG per tablet 1 tablet daily.       . Calcium Carbonate-Vitamin D (CALCIUM 600 + D PO) Take 600 mg by mouth daily.      . fish oil-omega-3 fatty acids 1000 MG capsule Take 2 g by mouth daily.      . hyaluronate sodium (RADIAPLEXRX) GEL Apply 1 application topically 2 (two) times daily. Apply to left breast after rad tx and bedtime, not 4 hours prior to rad txs      . meloxicam (MOBIC) 15 MG tablet 15 mg daily.       . Multiple Vitamin (MULTIVITAMIN) capsule Take 1 capsule by mouth daily.      Marland Kitchen omeprazole (PRILOSEC) 20 MG capsule       . sertraline (ZOLOFT) 100 MG tablet 100 mg daily.       . non-metallic deodorant Thornton Papas) MISC Apply 1 application topically daily. Apply after rad tx/prn daily       No current facility-administered medications for this encounter.    Physical Exam:  Filed Vitals:   11/08/12 1506  BP: 148/87  Pulse: 67  Temp: 98.7 F (37.1 C)  Resp: 20   she is a pleasant female in no distress sitting comfortably examining table. She has large pendulous breasts bilaterally. She has some hyperpigmentation in the upper inner quadrant of the left breast medial to her scar. Otherwise she has no lymphedema. Normal range of motion of her bilateral extremities. Alert and oriented x3.  IMPRESSION: Jill Austin is a 67 y.o. female status post breast conservation with no evidence of disease and resolving acute effects of treatment currently on an aromatase inhibitor  PLAN:  She looks great. I've encouraged her to use some protection in the treated area. I released her from followup with me. She is regularly scheduled followup with medical oncology. I encouraged her to call with any questions in the future. She agreed to do so. We talked about the importance of regular annual mammograms.    Lurline Hare, MD

## 2012-11-23 ENCOUNTER — Telehealth: Payer: Self-pay | Admitting: Oncology

## 2012-11-23 ENCOUNTER — Other Ambulatory Visit (HOSPITAL_BASED_OUTPATIENT_CLINIC_OR_DEPARTMENT_OTHER): Payer: Medicare Other | Admitting: Lab

## 2012-11-23 ENCOUNTER — Encounter: Payer: Self-pay | Admitting: Physician Assistant

## 2012-11-23 ENCOUNTER — Ambulatory Visit (HOSPITAL_BASED_OUTPATIENT_CLINIC_OR_DEPARTMENT_OTHER): Payer: Medicare Other | Admitting: Physician Assistant

## 2012-11-23 VITALS — BP 148/83 | HR 67 | Temp 98.3°F | Resp 20 | Ht 64.0 in | Wt 175.9 lb

## 2012-11-23 DIAGNOSIS — C50212 Malignant neoplasm of upper-inner quadrant of left female breast: Secondary | ICD-10-CM

## 2012-11-23 DIAGNOSIS — M8580 Other specified disorders of bone density and structure, unspecified site: Secondary | ICD-10-CM

## 2012-11-23 DIAGNOSIS — C50219 Malignant neoplasm of upper-inner quadrant of unspecified female breast: Secondary | ICD-10-CM

## 2012-11-23 DIAGNOSIS — Z170 Estrogen receptor positive status [ER+]: Secondary | ICD-10-CM

## 2012-11-23 DIAGNOSIS — M899 Disorder of bone, unspecified: Secondary | ICD-10-CM

## 2012-11-23 LAB — CBC WITH DIFFERENTIAL/PLATELET
BASO%: 0.2 % (ref 0.0–2.0)
Eosinophils Absolute: 0.1 10*3/uL (ref 0.0–0.5)
HCT: 38.1 % (ref 34.8–46.6)
MCHC: 32.5 g/dL (ref 31.5–36.0)
MONO#: 0.4 10*3/uL (ref 0.1–0.9)
NEUT#: 3 10*3/uL (ref 1.5–6.5)
NEUT%: 62.1 % (ref 38.4–76.8)
RBC: 4.34 10*6/uL (ref 3.70–5.45)
WBC: 4.8 10*3/uL (ref 3.9–10.3)
lymph#: 1.3 10*3/uL (ref 0.9–3.3)

## 2012-11-23 NOTE — Telephone Encounter (Signed)
gv pt appt schedule for November.  °

## 2012-11-23 NOTE — Progress Notes (Signed)
ID: Jill Austin   DOB: 1945/09/27  MR#: [id number]  MVH#:[id number]  PCP: [name], MD GYN: Meredeth Ide SU: Cicero Duck OTHER MD: Lurline Hare, Jeralyn Ruths, Christine McCuen   HISTORY OF PRESENT ILLNESS: Jill Austin had routine screening mammography at [facility] 05/05/2011, showing heterogeneously dense breasts. There were no findings of concern. On 05/07/2012, a new spiculated mass was noted in the left breast, and the patient was recalled for left breast ultrasound 05/08/2012. This confirmed a hypoechoic mass measuring 1.3 cm, at the 11:00 position 11 cm from the nipple. The left axilla was unremarkable.  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  The patient's subsequent history is as detailed below.  INTERVAL HISTORY: Jill Austin returns today for followup of her left breast cancer. Interval history is notable for the fact that Jill Austin has completed radiation therapy, and started on anastrozole in July. She's tolerating the anastrozole well, with no associated side effects that she is aware of.   REVIEW OF SYSTEMS: Jill Austin has had no recent illnesses and denies any fevers or chills. She does have hot flashes, but has not noticed these increasing significantly since starting the anastrozole. Her energy level is good. Her appetite is good and she denies any nausea, emesis, or change in bowel habits. She does have some mild rectal bleeding associated with hemorrhoids, and she scheduled for her next colonoscopy with Dr. Ronney Asters next month. Jill Austin has had no increased shortness of breath, chest pain, palpitations. She's had no abnormal headaches or dizziness. She does have some joint pain associated with arthritis, but this has not changed or worsened since starting the anastrozole. She's had no  peripheral swelling.  A detailed review of systems is otherwise noncontributory.   PAST MEDICAL HISTORY: Past Medical History  Diagnosis Date  . Breast cancer 05/08/12    invasive mammary ca, ER/PR+, HER 2 -  . Hypertension   . Arthritis   . GERD (gastroesophageal reflux disease)   . Wears glasses   . PONV (postoperative nausea and vomiting)   . 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 Dilation and curettage of uterus    . Hand osteoplasty  2007    left [facility]  . Colonoscopy    . Breast lumpectomy with needle localization and axillary sentinel lymph node bx Left 06/05/2012    Procedure: BREAST LUMPECTOMY WITH NEEDLE LOCALIZATION AND AXILLARY SENTINEL LYMPH NODE BX;  Surgeon: Currie Paris, MD;  Location: [facility];  Service: General;  Laterality: Left;  . Polypectomy      uterine   Hand surgery under Lim[facility] Removal of uterine polyp  FAMILY HISTORY History reviewed. No pertinent family history. The patient's father died at the age of 43 from heart problems. The patient's mother died at the age of 66, from heart problems. Jill Austin had one brother, no sisters. There is no history of breast or ovarian cancer in the family to her knowledge.  GYNECOLOGIC HISTORY: Menarche age 43, first live birth age 15, she is GX P2, last menstrual period approximately 1998. She took hormone replacement for approximately 7 years.  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 Her husband [name] (goes by [employer] is retired from [employer]. He is having significant spine problems. Their daughter Jill Austin teaches 4-year-olds in [employer] in [place]; daughter Jill Austin lives in [place] and is a homemaker. The  patient has 4 grandchildren. She attends the [employer]  ADVANCED DIRECTIVES: In place  HEALTH MAINTENANCE: History  Substance Use Topics  . Smoking status: Never Smoker   . Smokeless tobacco: Never Used  . Alcohol Use: 6.0 oz/week    10 Glasses of wine per week     Colonoscopy: 2002 (Scheduled to repeat Sept 2014, [facility])  PAP: 2011  Bone density: 07/25/2012 SOLIS: osteopenia, T - 1.8  Lipid panel:  Allergies  Allergen Reactions  . Penicillins Other (See Comments)    headache  . Vicodin [Hydrocodone-Acetaminophen] Other (See Comments)    Hyper     Current Outpatient Prescriptions  Medication Sig Dispense Refill  . ALPRAZolam (XANAX) 0.5 MG tablet       . amLODipine (NORVASC) 5 MG tablet 5 mg daily.       Marland Kitchen anastrozole (ARIMIDEX) 1 MG tablet Take 1 mg by mouth daily.      . bisoprolol-hydrochlorothiazide (ZIAC) 5-6.25 MG per tablet 1 tablet daily.       . Calcium Carbonate-Vitamin D (CALCIUM 600 + D PO) Take 600 mg by mouth daily.      . fish oil-omega-3 fatty acids 1000 MG capsule Take 2 g by mouth daily.      . hyaluronate sodium (RADIAPLEXRX) GEL Apply 1 application topically 2 (two) times daily. Apply to left breast after rad tx and bedtime, not 4 hours prior to rad txs      . meloxicam (MOBIC) 15 MG tablet 15 mg daily.       . Multiple Vitamin (MULTIVITAMIN) capsule Take 1 capsule by mouth daily.      . non-metallic deodorant Thornton Papas) MISC Apply 1 application topically daily. Apply after rad tx/prn daily      . omeprazole (PRILOSEC) 20 MG capsule       . sertraline (ZOLOFT) 100 MG tablet 100 mg daily.        No current facility-administered medications for this visit.    OBJECTIVE: Middle-aged white woman who looks well Filed Vitals:   11/23/12 1404  BP: 148/83  Pulse: 67  Temp: 98.3 F (36.8 C)  Resp: 20     Body mass index is 30.18 kg/(m^2).    ECOG FS: 0 Filed Weights   11/23/12 1404  Weight: 175 lb 14.4 oz (79.788 kg)    Sclerae  unicteric Oropharynx clear No cervical or supraclavicular adenopathy Lungs clear to auscultation with good excursion bilaterally, no rales or rhonchi Heart regular rate and rhythm Abd soft, nontender palpation, positive bowel sounds MSK no focal spinal tenderness to palpation and percussion, No peripheral edema Neuro: nonfocal, well oriented, pleasant affect Breasts: Breast tissue is dense bilaterally. The right breast is unremarkable. The left breast is status post  lumpectomy and radiation. No evidence of local recurrence. Axillae are benign bilaterally, no palpable adenopathy.   LAB RESULTS: Lab Results  Component Value Date   WBC 4.8 11/23/2012   NEUTROABS 3.0 11/23/2012   HGB 12.4 11/23/2012   HCT 38.1 11/23/2012   MCV 87.8 11/23/2012   PLT 150 11/23/2012      Chemistry      Component Value Date/Time   NA 139 06/04/2012 1200   NA 139 05/16/2012  0849   K 3.8 06/04/2012 1200   K 4.3 05/16/2012 0849   CL 103 06/04/2012 1200   CL 103 05/16/2012 0849   CO2 26 06/04/2012 1200   CO2 29 05/16/2012 0849   BUN 22 06/04/2012 1200   BUN 35.9* 05/16/2012 0849   CREATININE 0.79 06/04/2012 1200   CREATININE 0.9 05/16/2012 0849      Component Value Date/Time   CALCIUM 9.2 06/04/2012 1200   CALCIUM 9.8 05/16/2012 0849   ALKPHOS 87 05/16/2012 0849   AST 16 05/16/2012 0849   ALT 12 05/16/2012 0849   BILITOT 1.11 05/16/2012 0849       STUDIES: Bone density at [facility] on 07/25/2012 showed osteopenia.    ASSESSMENT: 67 y.o. [place] woman status post left lumpectomy and sentinel lymph node sampling 06/05/2012 for a mpT1c pN0, stage IA invasive ductal carcinoma, grade 3, estrogen and progesterone receptor positive both at 100%, HER-2/neu negative, with an MIB-1 of 12%.  (1) Oncotype DX showed a score of 14, predicting a distant recurrence risk of 9% within the next 10 years if the patient's only systemic treatment is tamoxifen for 5 years  (2) adjuvant radiation, completed 09/19/2012  (7) Started  anastrozole July 2014  PLAN:  With regards to her breast cancer, Vesper is doing well with no clinical evidence of disease recurrence at this time. She's tolerating the anastrozole well, and our plan is to continue for 5 years. We will, however, recheck her bone density in 2 years and if there is a significant change in her osteopenia we would consider switching to tamoxifen at that point. We're checking a vitamin D level today, and will recommend a separate vitamin D supplement if it comes back showing a deficiency. I have also encouraged Chelsae to continue taking her calcium supplements, and have encouraged her to walk daily as well.  I will see Shemicka again in 3 months to make sure she is still tolerating the anastrozole well. She's due for her next bilateral mammogram in late January/early February of next year.  Jasmine December voice understanding and agreement with this plan. As always, she knows to call with any changes or problems prior to her next appointment.   Zacory Fiola    11/23/2012

## 2012-11-24 LAB — VITAMIN D 25 HYDROXY (VIT D DEFICIENCY, FRACTURES): Vit D, 25-Hydroxy: 37 ng/mL (ref 30–89)

## 2013-02-07 ENCOUNTER — Other Ambulatory Visit: Payer: Self-pay

## 2013-02-11 ENCOUNTER — Telehealth: Payer: Self-pay | Admitting: *Deleted

## 2013-02-11 NOTE — Telephone Encounter (Signed)
Pt called requestingher labs to be the day of her appt on 02/19/13. gv appt for labs on 02/19/13 @ 1:15pm....td

## 2013-02-11 NOTE — Telephone Encounter (Signed)
Returned the pt called due to she lm stating to cancel her appt for 02/12/13, and to make her aware that i did cancel that appt. She canel 02/12/13 appt because her husband had back surgery...td

## 2013-02-12 ENCOUNTER — Other Ambulatory Visit: Payer: Medicare Other

## 2013-02-18 ENCOUNTER — Telehealth: Payer: Self-pay | Admitting: *Deleted

## 2013-02-18 NOTE — Telephone Encounter (Signed)
Pt called to cancel her appt for 02/19/13 due to her husband just came home from the hospital. However, she does not feel comfortable w/ leaving him alone. She will call back to reschedule her appts...td

## 2013-02-19 ENCOUNTER — Other Ambulatory Visit: Payer: Medicare Other

## 2013-02-19 ENCOUNTER — Ambulatory Visit: Payer: Medicare Other | Admitting: Physician Assistant

## 2013-02-26 ENCOUNTER — Telehealth: Payer: Self-pay | Admitting: *Deleted

## 2013-02-26 NOTE — Telephone Encounter (Signed)
Pt called to rs her appts . gv appt for 03/13/13 w/labs@ 1:15pm and ov @ 1:45pm...td

## 2013-03-13 ENCOUNTER — Other Ambulatory Visit (HOSPITAL_BASED_OUTPATIENT_CLINIC_OR_DEPARTMENT_OTHER): Payer: Medicare Other

## 2013-03-13 ENCOUNTER — Telehealth: Payer: Self-pay | Admitting: Oncology

## 2013-03-13 ENCOUNTER — Encounter: Payer: Self-pay | Admitting: Physician Assistant

## 2013-03-13 ENCOUNTER — Ambulatory Visit (HOSPITAL_BASED_OUTPATIENT_CLINIC_OR_DEPARTMENT_OTHER): Payer: Medicare Other | Admitting: Physician Assistant

## 2013-03-13 VITALS — BP 138/91 | HR 66 | Temp 98.6°F | Resp 18 | Ht 64.0 in | Wt 174.1 lb

## 2013-03-13 DIAGNOSIS — R232 Flushing: Secondary | ICD-10-CM | POA: Insufficient documentation

## 2013-03-13 DIAGNOSIS — C50212 Malignant neoplasm of upper-inner quadrant of left female breast: Secondary | ICD-10-CM

## 2013-03-13 DIAGNOSIS — C50219 Malignant neoplasm of upper-inner quadrant of unspecified female breast: Secondary | ICD-10-CM

## 2013-03-13 DIAGNOSIS — M899 Disorder of bone, unspecified: Secondary | ICD-10-CM

## 2013-03-13 DIAGNOSIS — Z170 Estrogen receptor positive status [ER+]: Secondary | ICD-10-CM

## 2013-03-13 DIAGNOSIS — Z853 Personal history of malignant neoplasm of breast: Secondary | ICD-10-CM

## 2013-03-13 LAB — CBC WITH DIFFERENTIAL/PLATELET
Basophils Absolute: 0 10*3/uL (ref 0.0–0.1)
Eosinophils Absolute: 0.1 10*3/uL (ref 0.0–0.5)
HCT: 39.9 % (ref 34.8–46.6)
HGB: 13.3 g/dL (ref 11.6–15.9)
NEUT#: 3.7 10*3/uL (ref 1.5–6.5)
NEUT%: 65.5 % (ref 38.4–76.8)
RDW: 14.2 % (ref 11.2–14.5)
lymph#: 1.3 10*3/uL (ref 0.9–3.3)

## 2013-03-13 LAB — COMPREHENSIVE METABOLIC PANEL (CC13)
Albumin: 4 g/dL (ref 3.5–5.0)
Anion Gap: 12 mEq/L — ABNORMAL HIGH (ref 3–11)
BUN: 13.8 mg/dL (ref 7.0–26.0)
CO2: 23 mEq/L (ref 22–29)
Calcium: 9.4 mg/dL (ref 8.4–10.4)
Chloride: 105 mEq/L (ref 98–109)
Glucose: 82 mg/dl (ref 70–140)
Potassium: 4.1 mEq/L (ref 3.5–5.1)

## 2013-03-13 NOTE — Telephone Encounter (Signed)
, °

## 2013-03-13 NOTE — Progress Notes (Signed)
ID: Jill Austin   DOB: 30-Jul-1945  MR#: [id number]  VHQ#:[id number]  PCP: [name], MD GYN: Meredeth Ide SU: Cicero Duck OTHER MD: Lurline Hare, Jeralyn Ruths, Christine McCuen  CHIEF COMPLAINT:  Left Breast Cancer    HISTORY OF PRESENT ILLNESS: Jill Austin had routine screening mammography at [facility] 05/05/2011, showing heterogeneously dense breasts. There were no findings of concern. On 05/07/2012, a new spiculated mass was noted in the left breast, and the patient was recalled for left breast ultrasound 05/08/2012. This confirmed a hypoechoic mass measuring 1.3 cm, at the 11:00 position 11 cm from the nipple. The left axilla was unremarkable.  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  The patient's subsequent history is as detailed below.  INTERVAL HISTORY: Jill Austin returns alone today for followup of her left breast cancer. Her husband is at home recovering from back surgery, but is doing well. Jill Austin herself is also doing well. She continues on anastrozole with good tolerance. She has some occasional hot flashes, sometimes during the day and sometimes at night, but does not find these particularly problematic. She has some chronic back pain which is stable but has had no increased joint pain. She denies any vaginal changes, no vaginal dryness and no abnormal vaginal bleeding.   Interval history is otherwise unremarkable.    REVIEW OF SYSTEMS: Jill Austin has had no fevers or chills.  Her energy level is good, as is her appetite.  She denies any nausea, emesis, or change in bowel or bladder habits.  Jill Austin has had no cough, increased shortness of breath, chest pain, or palpitations. She's had no abnormal headaches or dizziness. She's had no increased myalgias, arthralgias, or bony pain, and  denies any peripheral swelling. She has some chronic anxiety which she feels is well-controlled. She denies any depression or suicidal ideation.   A detailed review of systems is otherwise noncontributory.   PAST MEDICAL HISTORY: Past Medical History  Diagnosis Date  . Breast cancer 05/08/12    invasive mammary ca, ER/PR+, HER 2 -  . Hypertension   . Arthritis   . GERD (gastroesophageal reflux disease)   . Wears glasses   . PONV (postoperative nausea and vomiting)   . 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 Dilation and curettage of uterus    . Hand osteoplasty  2007    left [facility]  . Colonoscopy    . Breast lumpectomy with needle localization and axillary sentinel lymph node bx Left 06/05/2012    Procedure: BREAST LUMPECTOMY WITH NEEDLE LOCALIZATION AND AXILLARY SENTINEL LYMPH NODE BX;  Surgeon: Currie Paris, MD;  Location: [facility];  Service: General;  Laterality: Left;  . Polypectomy      uterine  Hand surgery under Jill Austin Removal of uterine polyp  FAMILY HISTORY History reviewed. No pertinent family history. The patient's father died at the age of 35 from heart problems. The patient's mother died at the age of 14, from heart problems. Jill Austin had one brother, no sisters. There is no history of breast or ovarian cancer in the family to her knowledge.  GYNECOLOGIC HISTORY: Menarche age 33,  first live birth age 43, she is GX P2, last menstrual period approximately 1998. She took hormone replacement for approximately 7 years.  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 Her husband Jill Austin (goes by [employer] is retired from [employer]. He is having significant spine problems. Their daughter Jill Austin teaches  4-year-olds in [employer] in [place]; daughter Jill Austin lives in [place] and is a homemaker. The patient has 4 grandchildren. She attends the [employer]  ADVANCED DIRECTIVES: In place  HEALTH MAINTENANCE: (Updated 03/13/2013) History  Substance Use Topics  . Smoking status: Never Smoker   . Smokeless tobacco: Never Used  . Alcohol Use: 6.0 oz/week    10 Glasses of wine per week    Colonoscopy: 2014, [facility]  PAP: 2011  Bone density: 07/25/2012 SOLIS: osteopenia, T - 1.8  Lipid panel: Dr. Gerri Austin    Allergies  Allergen Reactions  . Penicillins Other (See Comments)    headache  . Vicodin [Hydrocodone-Acetaminophen] Other (See Comments)    Hyper     Current Outpatient Prescriptions  Medication Sig Dispense Refill  . amLODipine (NORVASC) 5 MG tablet 5 mg daily.       Marland Kitchen anastrozole (ARIMIDEX) 1 MG tablet Take 1 mg by mouth daily.      . bisoprolol-hydrochlorothiazide (ZIAC) 5-6.25 MG per tablet 1 tablet daily.       . Calcium Carbonate-Vitamin D (CALCIUM 600 + D PO) Take 600 mg by mouth daily.      . fish oil-omega-3 fatty acids 1000 MG capsule Take 2 g by mouth daily.      . meloxicam (MOBIC) 15 MG tablet 15 mg daily.       . Multiple Vitamin (MULTIVITAMIN) capsule Take 1 capsule by mouth daily.      Marland Kitchen omeprazole (PRILOSEC) 20 MG capsule       . sertraline (ZOLOFT) 100 MG tablet 100 mg daily.        No current facility-administered medications for this visit.    OBJECTIVE: Middle-aged white woman who looks well and is in no acute distress. Filed Vitals:   03/13/13 1328  BP: 138/91  Pulse: 66  Temp: 98.6 F (37 C)  Resp: 18     Body mass index is 29.87 kg/(m^2).    ECOG FS: 0 Filed Weights   03/13/13 1328  Weight: 174 lb 1.6 oz (78.971 kg)   Physical Exam: HEENT:  Sclerae anicteric.  Oropharynx clear and moist.  NODES:  No cervical or supraclavicular lymphadenopathy palpated.  BREAST EXAM:  Right  breast is unremarkable. Left breast is status post lumpectomy with no suspicious nodularities or skin changes, and no evidence of local recurrence. Axillae are benign bilaterally, no palpable lymphadenopathy. LUNGS:  Clear to auscultation bilaterally with good excursion.  No wheezes or rhonchi HEART:  Regular rate and rhythm. No murmur  ABDOMEN:  Soft, nontender. No organomegaly palpated. Positive bowel sounds.  MSK:  No focal spinal tenderness to palpation. Good range of motion bilaterally in the upper extremities. EXTREMITIES:  No peripheral edema.   SKIN:  Benign with no visible rashes or skin changes. NEURO:  Nonfocal. Well oriented.  Positive affect.    LAB RESULTS: Lab Results  Component Value Date   WBC 5.7 03/13/2013   NEUTROABS 3.7 03/13/2013   HGB 13.3 03/13/2013   HCT 39.9 03/13/2013   MCV 88.9 03/13/2013   PLT 174 03/13/2013      Chemistry  Component Value Date/Time   NA 140 03/13/2013 1317   NA 139 06/04/2012 1200   K 4.1 03/13/2013 1317   K 3.8 06/04/2012 1200   CL 103 06/04/2012 1200   CL 103 05/16/2012 0849   CO2 23 03/13/2013 1317   CO2 26 06/04/2012 1200   BUN 13.8 03/13/2013 1317   BUN 22 06/04/2012 1200   CREATININE 0.8 03/13/2013 1317   CREATININE 0.79 06/04/2012 1200      Component Value Date/Time   CALCIUM 9.4 03/13/2013 1317   CALCIUM 9.2 06/04/2012 1200   ALKPHOS 84 03/13/2013 1317   AST 21 03/13/2013 1317   ALT 19 03/13/2013 1317   BILITOT 0.86 03/13/2013 1317       STUDIES: Bone density at [facility] on 07/25/2012 showed osteopenia.  Next diagnostic mammogram will be due at [facility] in mid February 2015.    ASSESSMENT: 67 y.o. [place] woman status post left lumpectomy and sentinel lymph node sampling 06/05/2012 for a mpT1c pN0, stage IA invasive ductal carcinoma, grade 3, estrogen and progesterone receptor positive both at 100%, HER-2/neu negative, with an MIB-1 of 12%.  (1) Oncotype DX showed a score of 14, predicting a distant recurrence risk of  9% within the next 10 years if the patient's only systemic treatment is tamoxifen for 5 years  (2) adjuvant radiation, completed 09/19/2012  (7) Started anastrozole July 2014  PLAN:  Cymone appears to be doing well, and continues to tolerate the anastrozole well which she will continue. The plan is to continue for total of 5 years, until July 2019. We will continue, however, to check her bone density every 2 years. If in 2016 we find that there is a significant change in osteopenia, we may consider switching to tamoxifen.  Vyla will have her next bilateral mammogram at [facility] in mid February and will return to see Dr. Darnelle Catalan in early March for labs and physical exam. If she is still doing well at that point, we will likely begin 6 month followups. This was all reviewed with Jill Austin who voices understanding and agreement with our plan. She also knows to call with any changes that occur prior to her next scheduled appointment.   Savon Bordonaro PA-C    03/13/2013

## 2013-06-06 ENCOUNTER — Other Ambulatory Visit (HOSPITAL_BASED_OUTPATIENT_CLINIC_OR_DEPARTMENT_OTHER): Payer: Medicare Other

## 2013-06-06 DIAGNOSIS — C50219 Malignant neoplasm of upper-inner quadrant of unspecified female breast: Secondary | ICD-10-CM

## 2013-06-06 DIAGNOSIS — Z853 Personal history of malignant neoplasm of breast: Secondary | ICD-10-CM

## 2013-06-06 LAB — CBC WITH DIFFERENTIAL/PLATELET
BASO%: 0.2 % (ref 0.0–2.0)
BASOS ABS: 0 10*3/uL (ref 0.0–0.1)
EOS%: 1.9 % (ref 0.0–7.0)
Eosinophils Absolute: 0.1 10*3/uL (ref 0.0–0.5)
HEMATOCRIT: 39.7 % (ref 34.8–46.6)
HEMOGLOBIN: 13 g/dL (ref 11.6–15.9)
LYMPH%: 22.2 % (ref 14.0–49.7)
MCH: 29.1 pg (ref 25.1–34.0)
MCHC: 32.7 g/dL (ref 31.5–36.0)
MCV: 89 fL (ref 79.5–101.0)
MONO#: 0.4 10*3/uL (ref 0.1–0.9)
MONO%: 7.6 % (ref 0.0–14.0)
NEUT#: 3.6 10*3/uL (ref 1.5–6.5)
NEUT%: 68.1 % (ref 38.4–76.8)
PLATELETS: 145 10*3/uL (ref 145–400)
RBC: 4.46 10*6/uL (ref 3.70–5.45)
RDW: 13.6 % (ref 11.2–14.5)
WBC: 5.3 10*3/uL (ref 3.9–10.3)
lymph#: 1.2 10*3/uL (ref 0.9–3.3)

## 2013-06-06 LAB — COMPREHENSIVE METABOLIC PANEL (CC13)
ALT: 15 U/L (ref 0–55)
ANION GAP: 8 meq/L (ref 3–11)
AST: 21 U/L (ref 5–34)
Albumin: 4 g/dL (ref 3.5–5.0)
Alkaline Phosphatase: 69 U/L (ref 40–150)
BILIRUBIN TOTAL: 1 mg/dL (ref 0.20–1.20)
BUN: 18.5 mg/dL (ref 7.0–26.0)
CALCIUM: 9.6 mg/dL (ref 8.4–10.4)
CHLORIDE: 107 meq/L (ref 98–109)
CO2: 28 meq/L (ref 22–29)
CREATININE: 0.8 mg/dL (ref 0.6–1.1)
Glucose: 90 mg/dl (ref 70–140)
Potassium: 4.1 mEq/L (ref 3.5–5.1)
Sodium: 143 mEq/L (ref 136–145)
Total Protein: 6.9 g/dL (ref 6.4–8.3)

## 2013-06-13 ENCOUNTER — Ambulatory Visit (HOSPITAL_BASED_OUTPATIENT_CLINIC_OR_DEPARTMENT_OTHER): Payer: Medicare Other | Admitting: Oncology

## 2013-06-13 ENCOUNTER — Telehealth: Payer: Self-pay | Admitting: Oncology

## 2013-06-13 VITALS — BP 155/82 | HR 66 | Temp 98.4°F | Resp 18 | Ht 64.0 in | Wt 177.2 lb

## 2013-06-13 DIAGNOSIS — M949 Disorder of cartilage, unspecified: Secondary | ICD-10-CM

## 2013-06-13 DIAGNOSIS — M899 Disorder of bone, unspecified: Secondary | ICD-10-CM

## 2013-06-13 DIAGNOSIS — Z170 Estrogen receptor positive status [ER+]: Secondary | ICD-10-CM

## 2013-06-13 DIAGNOSIS — C50219 Malignant neoplasm of upper-inner quadrant of unspecified female breast: Secondary | ICD-10-CM

## 2013-06-13 NOTE — Progress Notes (Signed)
ID: Jill Austin   DOB: March 23, 1946  MR#: [id number]  WUX#:[id number]  PCP: Jill Bellows, MD GYN: Jill Austin SU: Jill Austin OTHER MD: Jill Austin, Jill Austin, Jill Austin  CHIEF COMPLAINT:  Left Breast Cancer    HISTORY OF PRESENT ILLNESS: Jill Austin had routine screening mammography at [facility] 05/05/2011, showing heterogeneously dense breasts. There were no findings of concern. On 05/07/2012, a new spiculated mass was noted in the left breast, and the patient was recalled for left breast ultrasound 05/08/2012. This confirmed a hypoechoic mass measuring 1.3 cm, at the 11:00 position 11 cm from the nipple. The left axilla was unremarkable.  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  The patient's subsequent history is as detailed below.  INTERVAL HISTORY: Jill Austin returns today for followup of her breast cancer. Interval history is unremarkable. She is tolerating the anastrozole with occasional hot flashes, no significant vaginal dryness, and no other problems that she is aware of.  REVIEW OF SYSTEMS: Jill Austin has some stable arthritis problems which are not more intense or persistent than before. A detailed review of systems was otherwise entirely negative  PAST MEDICAL HISTORY: Past Medical History  Diagnosis Date  . Breast cancer 05/08/12    invasive mammary ca, ER/PR+, HER 2 -  . Hypertension   . Arthritis   . GERD (gastroesophageal reflux disease)   . Wears glasses   . PONV (postoperative nausea and vomiting)   . 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 Dilation and  curettage of uterus    . Hand osteoplasty  2007    left [facility]  . Colonoscopy    . Breast lumpectomy with needle localization and axillary sentinel lymph node bx Left 06/05/2012    Procedure: BREAST LUMPECTOMY WITH NEEDLE LOCALIZATION AND AXILLARY SENTINEL LYMPH NODE BX;  Surgeon: Jill Lasso, MD;  Location: [place];  Service: General;  Laterality: Left;  . Polypectomy      uterine  Hand surgery under Jill Austin Removal of uterine polyp  FAMILY HISTORY No family history on file. The patient's father died at the age of 68 from heart problems. The patient's mother died at the age of 58, from heart problems. Jill Austin had one brother, no sisters. There is no history of breast or ovarian cancer in the family to her knowledge.  GYNECOLOGIC HISTORY: Menarche age 46, first live birth age 66, she is [place] P2, last menstrual period approximately 1998. She took hormone replacement for approximately 7 years.  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 Her husband Jill Austin (goes by Jill Austin is retired from [employer]. He is having significant spine problems. Their daughter Jill Austin teaches 44-year-olds in [place] in [place]; daughter Jill Austin lives in [place] and is a homemaker. The patient has 4 grandchildren. She attends the [employer] of [place]: In place  HEALTH MAINTENANCE: (Updated 03/13/2013) History  Substance Use Topics  . Smoking status: Never Smoker   . Smokeless tobacco: Never Used  . Alcohol Use: 6.0 oz/week    10 Glasses of wine  per week    Colonoscopy: 2014, Jill Austin  PAP: 2011  Bone density: 07/25/2012 Jill Austin: osteopenia, T - 1.8  Lipid panel: Dr. Jonny Austin    Allergies  Allergen Reactions  . Penicillins Other (See Comments)    headache  . Vicodin [Hydrocodone-Acetaminophen] Other (See Comments)    Hyper     Current Outpatient  Prescriptions  Medication Sig Dispense Refill  . amLODipine (NORVASC) 5 MG tablet 5 mg daily.       Marland Kitchen anastrozole (ARIMIDEX) 1 MG tablet Take 1 mg by mouth daily.      . bisoprolol-hydrochlorothiazide (Jill Austin) 5-6.25 MG per tablet 1 tablet daily.       . Calcium Carbonate-Vitamin D (CALCIUM 600 + D PO) Take 600 mg by mouth daily.      . fish oil-omega-3 fatty acids 1000 MG capsule Take 2 g by mouth daily.      . meloxicam (MOBIC) 15 MG tablet 15 mg daily.       . Multiple Vitamin (MULTIVITAMIN) capsule Take 1 capsule by mouth daily.      Marland Kitchen omeprazole (PRILOSEC) 20 MG capsule       . sertraline (ZOLOFT) 100 MG tablet 100 mg daily.        No current facility-administered medications for this visit.    OBJECTIVE: Middle-aged white woman  in no acute distress. Filed Vitals:   06/13/13 1418  BP: 155/82  Pulse: 66  Temp: 98.4 F (36.9 C)  Resp: 18     Body mass index is 30.4 kg/(m^2).    ECOG FS: 0 Filed Weights   06/13/13 1418  Weight: 177 lb 3.2 oz (80.377 kg)   Sclerae unicteric, pupils equal and round Oropharynx clear and moist No cervical or supraclavicular adenopathy Lungs no rales or rhonchi Heart regular rate and rhythm Abd soft, nontender, positive bowel sounds MSK no focal spinal tenderness, no upper extremity lymphedema Neuro: nonfocal, well oriented, appropriate affect Breasts: The right breast is unremarkable. The left breast is status post lumpectomy and radiation. There is no evidence of local recurrence. The left axilla is benign.   LAB RESULTS: Lab Results  Component Value Date   WBC 5.3 06/06/2013   NEUTROABS 3.6 06/06/2013   HGB 13.0 06/06/2013   HCT 39.7 06/06/2013   MCV 89.0 06/06/2013   PLT 145 06/06/2013      Chemistry      Component Value Date/Time   NA 143 06/06/2013 1327   NA 139 06/04/2012 1200   K 4.1 06/06/2013 1327   K 3.8 06/04/2012 1200   CL 103 06/04/2012 1200   CL 103 05/16/2012 0849   CO2 28 06/06/2013 1327   CO2 26 06/04/2012 1200   BUN 18.5 06/06/2013 1327    BUN 22 06/04/2012 1200   CREATININE 0.8 06/06/2013 1327   CREATININE 0.79 06/04/2012 1200      Component Value Date/Time   CALCIUM 9.6 06/06/2013 1327   CALCIUM 9.2 06/04/2012 1200   ALKPHOS 69 06/06/2013 1327   AST 21 06/06/2013 1327   ALT 15 06/06/2013 1327   BILITOT 1.00 06/06/2013 1327       STUDIES: Bone density at [facility] on 07/25/2012 showed osteopenia.  Mammogram at [facility] 05/09/2013 unremarkable    ASSESSMENT: 68 y.o. [place] woman status post left lumpectomy and sentinel lymph node sampling 06/05/2012 for a mpT1c pN0, stage IA invasive ductal carcinoma, grade 3, estrogen and progesterone receptor positive both at 100%, HER-2/neu negative, with an MIB-1 of 12%.  (1) Oncotype DX showed a  score of 14, predicting a distant recurrence risk of 9% within the next 10 years if the patient's only systemic treatment is tamoxifen for 5 years  (2) adjuvant radiation, completed 09/19/2012  (3) Started anastrozole July 2014  (4) osteopenia with a T score of -1.8 on bone scan April 2014    PLAN:  Jill Austin is doing fine from a breast cancer point of view. There is no evidence of disease recurrence. She is tolerating the anastrozole well, and the plan is going to be to continue for 5 years.  I think it would be a good thing for her to start an exercise program and today I gave her the [place] pamphlet and explained how to enroll. She understands the goal is 45 minutes 5 times a week.  Jill Austin and os to call for any problems that may develop before the next visit here.  Chauncey Cruel, MD     06/13/2013

## 2013-06-13 NOTE — Telephone Encounter (Signed)
, °

## 2013-09-12 ENCOUNTER — Other Ambulatory Visit: Payer: Self-pay | Admitting: *Deleted

## 2013-09-12 DIAGNOSIS — C50219 Malignant neoplasm of upper-inner quadrant of unspecified female breast: Secondary | ICD-10-CM

## 2013-09-12 MED ORDER — ANASTROZOLE 1 MG PO TABS: 1.0000 mg | ORAL_TABLET | Freq: Every day | ORAL | Status: DC

## 2013-11-23 IMAGING — CT CT ORBITS W/O CM
3 of 4 series · 16 of 47 positions shown, 19 images · non-contrast
Comparison: None.

CLINICAL DATA: Headaches, fell, bruising around left thigh, blurred
vision, breast cancer

CT ORBITS WITHOUT CONTRAST
TECHNIQUE: Multidetector CT imaging of the orbits was performed
following the standard protocol without intravenous contrast.

[Series 3: orbits bone · axial · 0.34mm/px · z∈[-52,+40]mm · 10 of 45 slices shown, 13 images]
[im 4/45  brain]
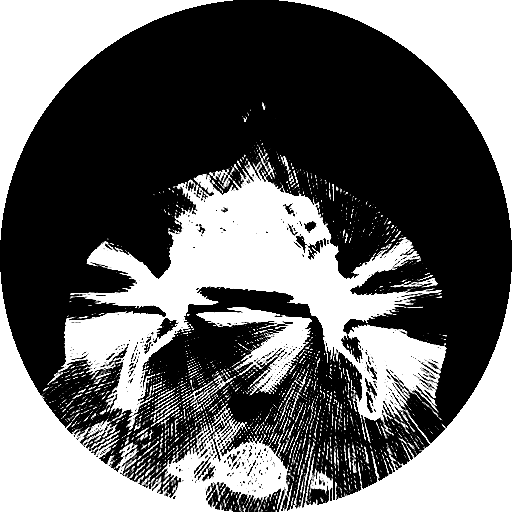
[im 4/45  bone]
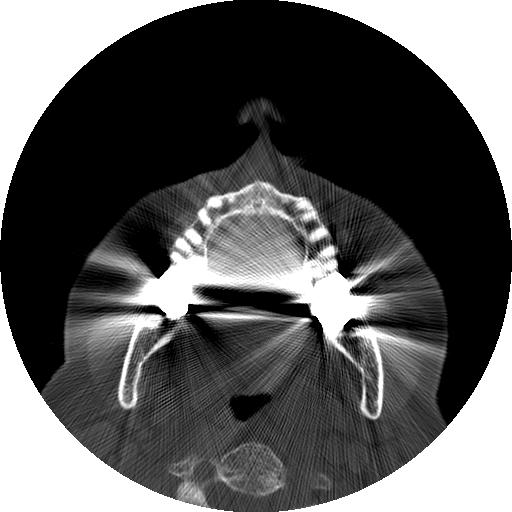
[im 7/45  bone]
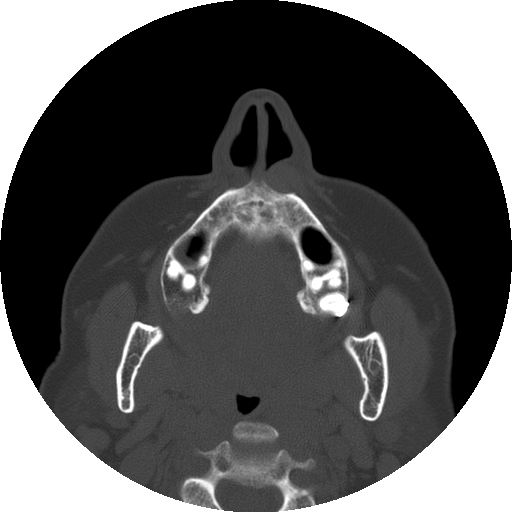
[im 13/45  bone]
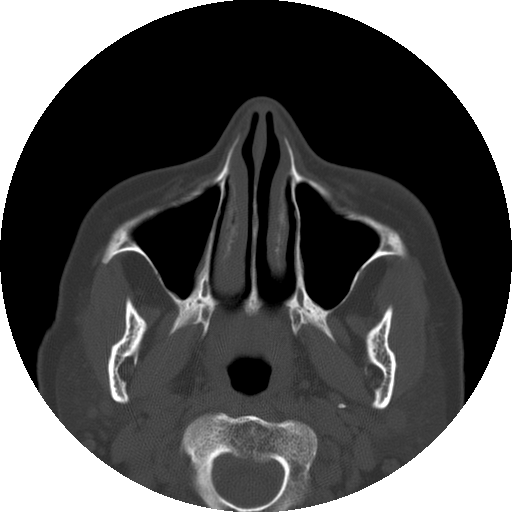
[im 16/45  bone]
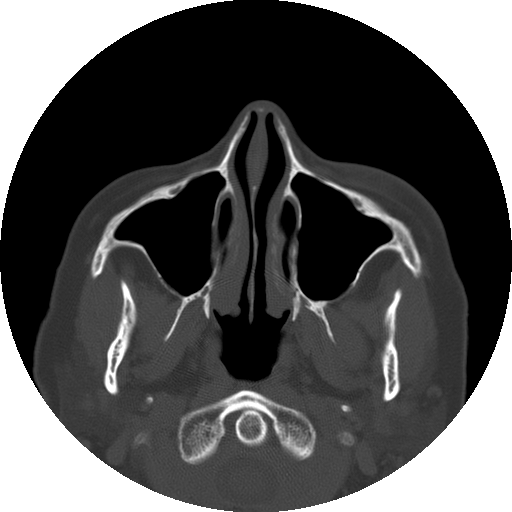
[im 19/45  brain]
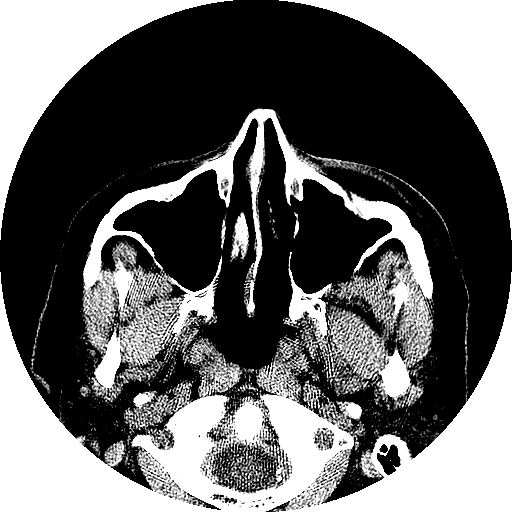
[im 19/45  bone]
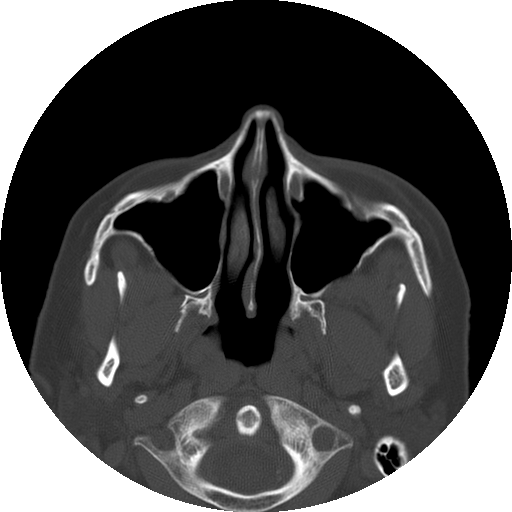
[im 26/45  bone]
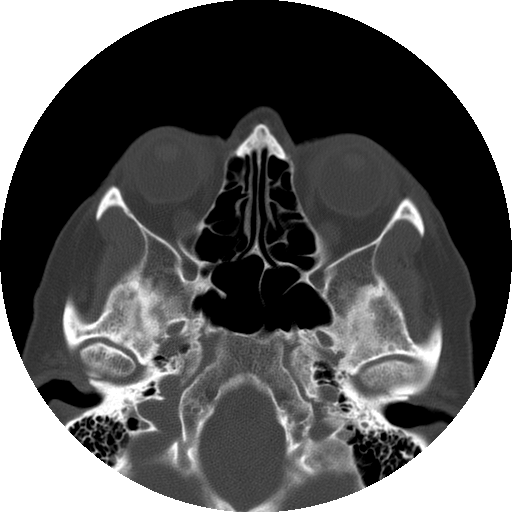
[im 29/45  bone]
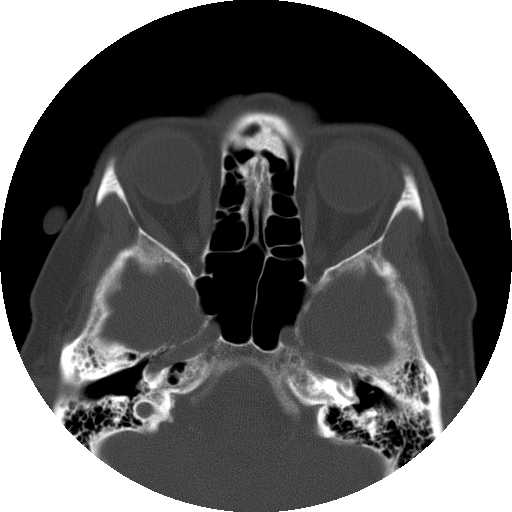
[im 32/45  bone]
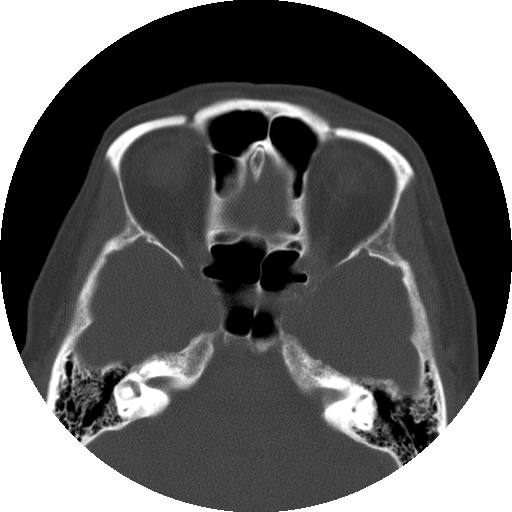
[im 38/45  brain]
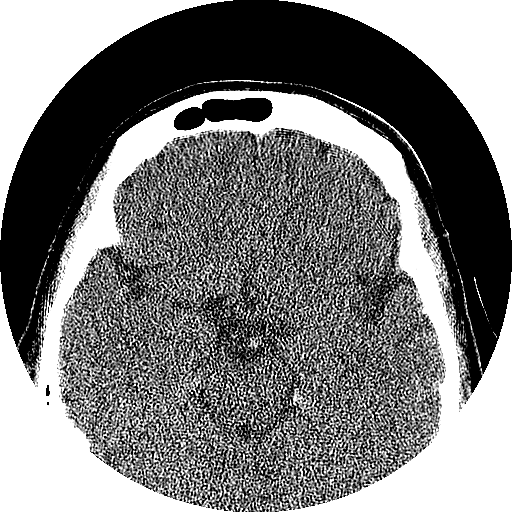
[im 38/45  bone]
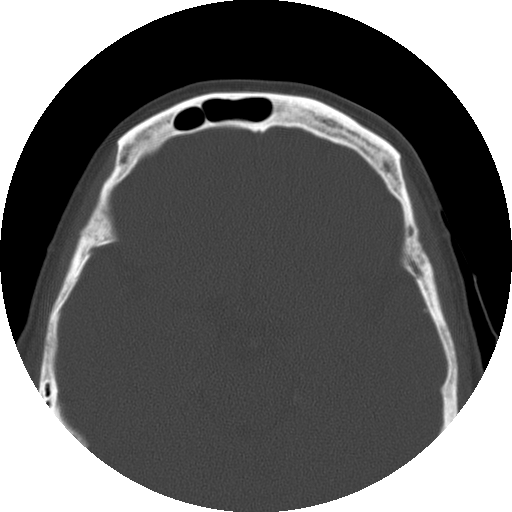
[im 41/45  bone]
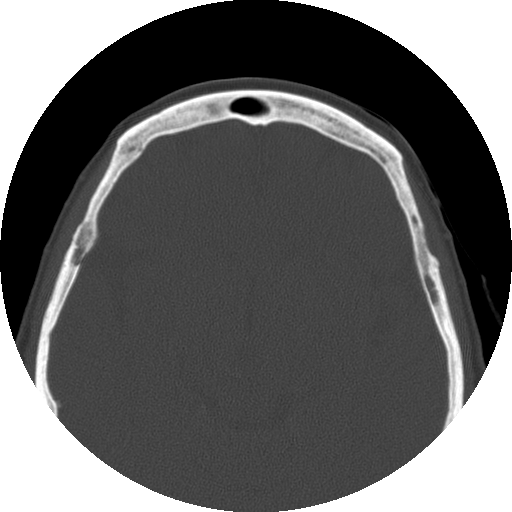

[Series 400: sagittal · sagittal · 0.34mm/px · 3 of 75 slices shown]
[im 25/75  bone]
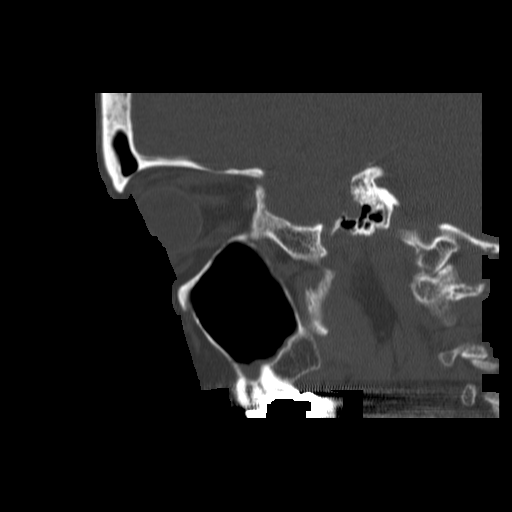
[im 38/75  bone]
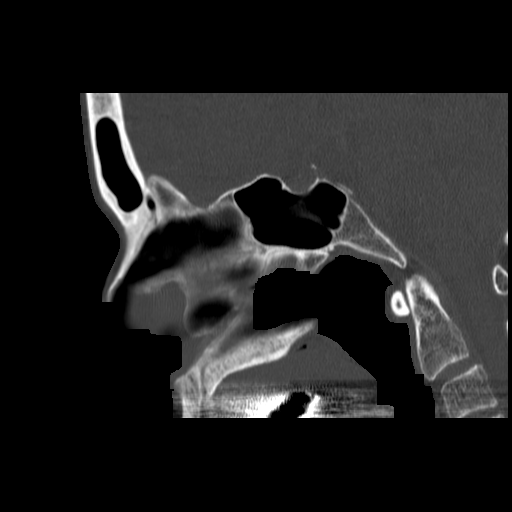
[im 50/75  bone]
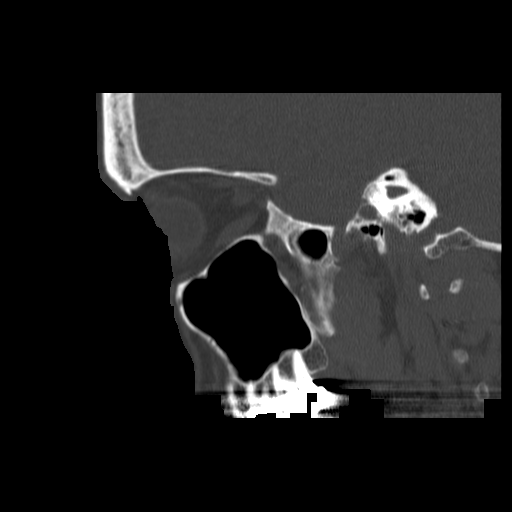

[Series 401: coronal · coronal · 0.34mm/px · 3 of 67 slices shown]
[im 23/67  bone]
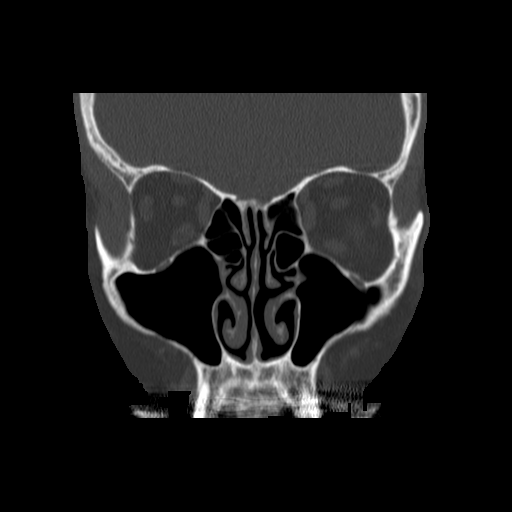
[im 30/67  bone]
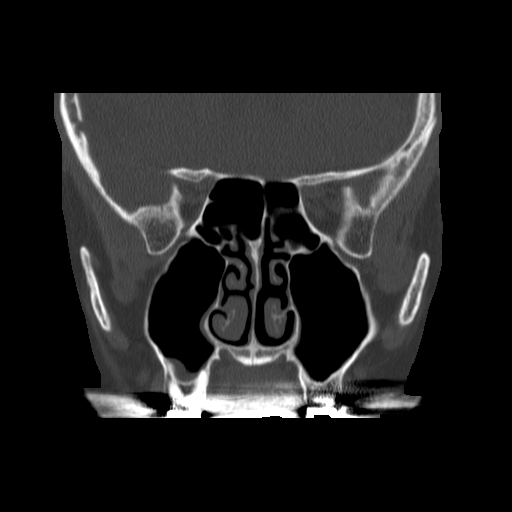
[im 37/67  bone]
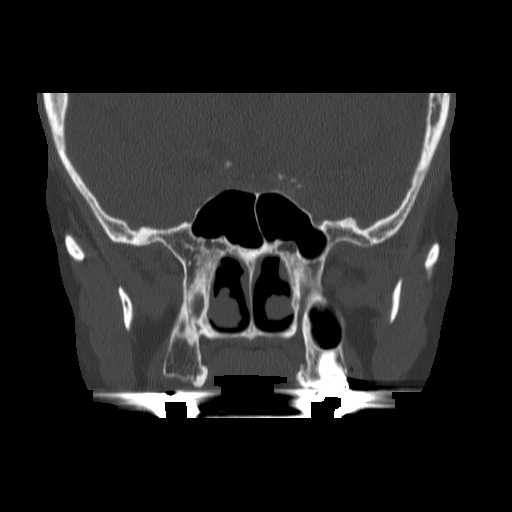

[16 of 47 positions shown; findings below may reference images not displayed]

FINDINGS: There is minimal preseptal periorbital soft tissue
swelling on the left.  Both globes are intact.  Normal and
symmetric optic nerves.  There is no post-septal hematoma. There is
nose asymmetry of the orbital fat or proptosis.  There is no
blowout injury.  There is no significant paranasal sinus disease.
Slightly greater than 1 cm sized lateral nasal polyp on the left is
incidental.

There is no visible facial fracture.  Visualized cavernous sinuses
are grossly unremarkable.
IMPRESSION: Minimal preseptal periorbital soft tissue swelling on the left.  No
visible facial fracture.  No orbital mass.  No blowout injury.

## 2013-12-12 ENCOUNTER — Telehealth: Payer: Self-pay | Admitting: Oncology

## 2013-12-12 ENCOUNTER — Other Ambulatory Visit (HOSPITAL_BASED_OUTPATIENT_CLINIC_OR_DEPARTMENT_OTHER): Payer: Medicare Other

## 2013-12-12 ENCOUNTER — Ambulatory Visit (HOSPITAL_BASED_OUTPATIENT_CLINIC_OR_DEPARTMENT_OTHER): Payer: Medicare Other | Admitting: Nurse Practitioner

## 2013-12-12 ENCOUNTER — Encounter: Payer: Self-pay | Admitting: Nurse Practitioner

## 2013-12-12 VITALS — BP 137/76 | HR 62 | Temp 98.1°F | Resp 20 | Ht 64.0 in | Wt 174.3 lb

## 2013-12-12 DIAGNOSIS — T451X5A Adverse effect of antineoplastic and immunosuppressive drugs, initial encounter: Secondary | ICD-10-CM

## 2013-12-12 DIAGNOSIS — C50212 Malignant neoplasm of upper-inner quadrant of left female breast: Secondary | ICD-10-CM

## 2013-12-12 DIAGNOSIS — M949 Disorder of cartilage, unspecified: Secondary | ICD-10-CM

## 2013-12-12 DIAGNOSIS — M8580 Other specified disorders of bone density and structure, unspecified site: Secondary | ICD-10-CM

## 2013-12-12 DIAGNOSIS — R232 Flushing: Secondary | ICD-10-CM

## 2013-12-12 DIAGNOSIS — C50219 Malignant neoplasm of upper-inner quadrant of unspecified female breast: Secondary | ICD-10-CM

## 2013-12-12 DIAGNOSIS — M25542 Pain in joints of left hand: Secondary | ICD-10-CM

## 2013-12-12 DIAGNOSIS — M899 Disorder of bone, unspecified: Secondary | ICD-10-CM

## 2013-12-12 DIAGNOSIS — Z170 Estrogen receptor positive status [ER+]: Secondary | ICD-10-CM

## 2013-12-12 DIAGNOSIS — M25541 Pain in joints of right hand: Secondary | ICD-10-CM

## 2013-12-12 LAB — CBC WITH DIFFERENTIAL/PLATELET
BASO%: 0.6 % (ref 0.0–2.0)
BASOS ABS: 0 10*3/uL (ref 0.0–0.1)
EOS ABS: 0.1 10*3/uL (ref 0.0–0.5)
EOS%: 2 % (ref 0.0–7.0)
HEMATOCRIT: 40.4 % (ref 34.8–46.6)
HEMOGLOBIN: 13 g/dL (ref 11.6–15.9)
LYMPH#: 1.4 10*3/uL (ref 0.9–3.3)
LYMPH%: 21.9 % (ref 14.0–49.7)
MCH: 28.8 pg (ref 25.1–34.0)
MCHC: 32.1 g/dL (ref 31.5–36.0)
MCV: 89.8 fL (ref 79.5–101.0)
MONO#: 0.4 10*3/uL (ref 0.1–0.9)
MONO%: 6.9 % (ref 0.0–14.0)
NEUT%: 68.6 % (ref 38.4–76.8)
NEUTROS ABS: 4.4 10*3/uL (ref 1.5–6.5)
Platelets: 184 10*3/uL (ref 145–400)
RBC: 4.5 10*6/uL (ref 3.70–5.45)
RDW: 14.1 % (ref 11.2–14.5)
WBC: 6.4 10*3/uL (ref 3.9–10.3)

## 2013-12-12 LAB — COMPREHENSIVE METABOLIC PANEL (CC13)
ALBUMIN: 4 g/dL (ref 3.5–5.0)
ALT: 12 U/L (ref 0–55)
ANION GAP: 9 meq/L (ref 3–11)
AST: 18 U/L (ref 5–34)
Alkaline Phosphatase: 78 U/L (ref 40–150)
BUN: 17 mg/dL (ref 7.0–26.0)
CALCIUM: 9.1 mg/dL (ref 8.4–10.4)
CHLORIDE: 106 meq/L (ref 98–109)
CO2: 27 meq/L (ref 22–29)
CREATININE: 0.9 mg/dL (ref 0.6–1.1)
GLUCOSE: 91 mg/dL (ref 70–140)
POTASSIUM: 3.9 meq/L (ref 3.5–5.1)
Sodium: 141 mEq/L (ref 136–145)
Total Bilirubin: 0.84 mg/dL (ref 0.20–1.20)
Total Protein: 7 g/dL (ref 6.4–8.3)

## 2013-12-12 NOTE — Progress Notes (Signed)
ID: [name]   DOB: 11/16/1945  MR#: [id number]  CSN#:[id number]  PCP: Jonathon Bellows, MD GYN: Elyse Hsu SU: Osborn Coho OTHER MD: Thea Silversmith, Johnnette Gourd, Christine McCuen  CHIEF COMPLAINT:  Left Breast Cancer CURRENT TREATMENT: anastrozole 34m daily  HISTORY OF PRESENT ILLNESS: [facility] showing heterogeneously dense breasts. There were no findings of concern. On 05/07/2012, a new spiculated mass was noted in the left breast, and the patient was recalled for left breast ultrasound 05/08/2012. This confirmed a hypoechoic mass measuring 1.3 cm, at the 11:00 position 11 cm from the nipple. The left axilla was unremarkable.  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  The patient's subsequent history is as detailed below.  INTERVAL HISTORY: SJinreturns today for follow up of her breast cancer. She has been on anastrozole since July 2014. She denies vaginal changes. She experiences hot flashes daily, but denies the need for intervention beside fanning herself. She also complains of arthralgia to her bilateral hands and 1st knuckle of each finger. This has flared up within the last 3 months or so, but does not occur daily. She has a documented history of arthritis pain. She takes 190mmobic daily already for her back pain, and she believes it is helping the pain in her hands. The interval history is unremarkable.   REVIEW OF SYSTEMS: A detailed review of systems was entirely negative besides as noted above.   PAST MEDICAL HISTORY: Past Medical History  Diagnosis Date  . Breast cancer 05/08/12    invasive mammary ca, ER/PR+, HER 2 -  . Hypertension   . Arthritis   . GERD (gastroesophageal reflux disease)   . Wears glasses   . PONV  (postoperative nausea and vomiting)   . 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 Dilation and curettage of uterus    . Hand osteoplasty  2007    left [facility]  . Colonoscopy    . Breast lumpectomy with needle localization and axillary sentinel lymph node bx Left 06/05/2012    Procedure: BREAST LUMPECTOMY WITH NEEDLE LOCALIZATION AND AXILLARY SENTINEL LYMPH NODE BX;  Surgeon: ChHaywood LassoMD;  [facility] Service[facility];  Laterality: Left;  . Polypectomy      uterine  Hand surgery under PaArdine Bjorkemoval of uterine polyp  FAMILY HISTORY History reviewed. No pertinent family history. The patient's father died at the age of 613rom heart problems. The patient's mother died at the age of 833from heart problems. ShZouaad one brother, no sisters. There is no history of breast or ovarian cancer in the family to her knowledge.  GYNECOLOGIC HISTORY: Menarche age [age]first live birth age [age]she is [place]2, last menstrual period approximately 1998. She took hormone replacement for approximately 7 years.  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 Their daughter ShShelbi Vaccaroeaches 4-40ear-olds in [place] SuAurelia Grasives in [place] and is a Agricultural engineer. The patient has 4 grandchildren. She attends the [employer] of [place]: In place  HEALTH MAINTENANCE: (Updated 03/13/2013) History  Substance Use Topics  . Smoking status: Never Smoker   . Smokeless tobacco: Never Used  . Alcohol Use: 6.0 oz/week    10 Glasses of wine  per week    Colonoscopy: 2014, Bu[facility]ini  PAP: 2011  Bone density: 07/25/2012 [facility]: osteopenia, T - 1.8  Lipid panel: Dr. Jonny Ruiz    Allergies  Allergen Reactions  . Penicillins Other (See Comments)    headache  . Vicodin [Hydrocodone-Acetaminophen] Other (See Comments)    Hyper     Current Outpatient Prescriptions  Medication Sig Dispense Refill  . amLODipine (NORVASC) 5 MG tablet 5 mg daily.       Marland Kitchen anastrozole (ARIMIDEX) 1 MG tablet Take 1 tablet (1 mg total) by mouth daily.  90 tablet  1  . bisoprolol-hydrochlorothiazide (ZIAC) 5-6.25 MG per tablet 1 tablet daily.       . Calcium Carbonate-Vitamin D (CALCIUM 600 + D PO) Take 600 mg by mouth daily.      . fish oil-omega-3 fatty acids 1000 MG capsule Take 2 g by mouth daily.      . meloxicam (MOBIC) 15 MG tablet 15 mg daily.       . Multiple Vitamin (MULTIVITAMIN) capsule Take 1 capsule by mouth daily.      Marland Kitchen omeprazole (PRILOSEC) 20 MG capsule       . pravastatin (PRAVACHOL) 10 MG tablet Take 20 mg by mouth daily.      . sertraline (ZOLOFT) 100 MG tablet 100 mg daily.        No current facility-administered medications for this visit.    OBJECTIVE: Middle-aged white woman  in no acute distress. Filed Vitals:   12/12/13 1505  BP: 137/76  Pulse: 62  Temp: 98.1 F (36.7 C)  Resp: 20     Body mass index is 29.9 kg/(m^2).    ECOG FS: 0 Filed Weights   12/12/13 1505  Weight: 174 lb 4.8 oz (79.062 kg)   Skin: warm, dry, distal interphalangeal nodes of each finger demonstrate mild welling HEENT: sclerae anicteric, conjunctivae pink, oropharynx clear. No thrush or mucositis.  Lymph Nodes: No cervical or supraclavicular lymphadenopathy  Lungs: clear to auscultation bilaterally, no rales, wheezes, or rhonci  Heart: regular rate and rhythm  Abdomen: round, soft, non tender, positive bowel sounds  Musculoskeletal: No focal spinal tenderness, no peripheral edema  Neuro: non focal, well oriented, positive affect  Breasts:  left breast status post lumpectomy and radiation. No evidence of local recurrence. Left axilla benign. Right breast unremarkable.  LAB RESULTS: Lab Results  Component Value Date   WBC 6.4 12/12/2013   NEUTROABS 4.4 12/12/2013   HGB 13.0 12/12/2013   HCT 40.4 12/12/2013   MCV 89.8 12/12/2013   PLT 184 12/12/2013      Chemistry      Component Value Date/Time   NA 141 12/12/2013 1455   NA 139 06/04/2012 1200   K 3.9 12/12/2013 1455   K 3.8 06/04/2012 1200   CL 103 06/04/2012 1200   CL 103 05/16/2012 0849   CO2 27 12/12/2013 1455   CO2 26 06/04/2012 1200   BUN 17.0 12/12/2013 1455   BUN 22 06/04/2012 1200   CREATININE 0.9 12/12/2013 1455   CREATININE 0.79 06/04/2012 1200      Component Value Date/Time   CALCIUM 9.1 12/12/2013 1455  CALCIUM 9.2 06/04/2012 1200   ALKPHOS 78 12/12/2013 1455   AST 18 12/12/2013 1455   ALT 12 12/12/2013 1455   BILITOT 0.84 12/12/2013 1455       STUDIES: Bone density at [facility] on 07/25/2012 showed osteopenia.  Mammogram at [facility] 05/09/2013 unremarkable   ASSESSMENT: 68 y.o. [place] woman status post left lumpectomy and sentinel lymph node sampling 06/05/2012 for a mpT1c pN0, stage IA invasive ductal carcinoma, grade 3, estrogen and progesterone receptor positive both at 100%, HER-2/neu negative, with an MIB-1 of 12%.  (1) Oncotype DX showed a score of 14, predicting a distant recurrence risk of 9% within the next 10 years if the patient's only systemic treatment is tamoxifen for 5 years  (2) adjuvant radiation, completed 09/19/2012  (3) Started anastrozole July 2014  (4) osteopenia with a T score of -1.8 on bone scan April 2014   PLAN:  Jadon is doing well as far as her breast cancer is concerned. She is now 1.5 years out from her definitive surgery with evidence of disease recurrence. The labs were reviewed in detail with her and were entirely normal. We discussed her arthralgia and she decided that she is tolerating the anastrozole too well otherwise to  consider switching to another anti-estrogen therapy. The pain is not constant and has not lasted long enough to truly impede her activity. She will of course continue the anastrozole with a goal of 5 years of treatment on this drug. She has not yet started an exercise program, so I encouraged her to walk at least around her neighborhood daily.   Raivyn's last bone density scan demonstrated osteopenia. She is due for another scan next April. Her next mammogram will occur in February. She will return for labs and an office visit in March. Shene understands and agrees with this plan. She knows a goal of treatment in her case is cure. She has been encouraged to call with any issues that might develop before her next visit here.   Marcelino Duster, NP     12/12/2013

## 2013-12-12 NOTE — Telephone Encounter (Signed)
per pof to sch appt-cld to sch DEXA-[facility] stated pt not due for one until after 07/26/14 and their sch is not set up out that far yet-Adv pt to cll and make appt in Jan 2016-pt understood-gave pt copy of sch

## 2014-02-03 ENCOUNTER — Encounter: Payer: Self-pay | Admitting: Nurse Practitioner

## 2014-03-17 ENCOUNTER — Ambulatory Visit (HOSPITAL_BASED_OUTPATIENT_CLINIC_OR_DEPARTMENT_OTHER): Payer: Medicare Other | Admitting: Nurse Practitioner

## 2014-03-17 ENCOUNTER — Encounter: Payer: Self-pay | Admitting: Nurse Practitioner

## 2014-03-17 ENCOUNTER — Telehealth: Payer: Self-pay | Admitting: *Deleted

## 2014-03-17 VITALS — BP 159/86 | HR 62 | Temp 98.5°F | Resp 18 | Ht 64.0 in | Wt 174.5 lb

## 2014-03-17 DIAGNOSIS — M25541 Pain in joints of right hand: Secondary | ICD-10-CM

## 2014-03-17 DIAGNOSIS — T451X5A Adverse effect of antineoplastic and immunosuppressive drugs, initial encounter: Secondary | ICD-10-CM

## 2014-03-17 DIAGNOSIS — R21 Rash and other nonspecific skin eruption: Secondary | ICD-10-CM

## 2014-03-17 DIAGNOSIS — C50212 Malignant neoplasm of upper-inner quadrant of left female breast: Secondary | ICD-10-CM

## 2014-03-17 DIAGNOSIS — Z170 Estrogen receptor positive status [ER+]: Secondary | ICD-10-CM

## 2014-03-17 DIAGNOSIS — M25542 Pain in joints of left hand: Principal | ICD-10-CM

## 2014-03-17 DIAGNOSIS — R232 Flushing: Secondary | ICD-10-CM

## 2014-03-17 NOTE — Assessment & Plan Note (Signed)
Patient has noticed some arthralgias of her bilateral hands since initiating the anastrozole in July 2014.  She does take Mobic on an occasional basis for the discomfort.

## 2014-03-17 NOTE — Assessment & Plan Note (Signed)
Patient does have a chronic rash to her anterior bilateral shins for the past year.  She states that this rash does not itch or cause any type of discomfort whatsoever.  Patient has trace outline of rash to her bilateral breast/anterior chest.  Patient does complain of some pruritus to the chest wall rash.  Rash does appear mild; and slowly resolving.  There is no other new rash noted on exam.  Advised patient that she could try both Benadryl 25 mg every 6 hours and Pepcid 20 mg every 12 hours to see if this helps.  Also advised patient would review patient's intermittent rash to her chest with Dr. Jana Hakim to see if he thinks this could be related to the anastrozole.  Advised patient we'll call her back first thing in the morning regarding follow-up planned.  Also reviewed with patient that she may very well need a dermatology consult for further evaluation as well.

## 2014-03-17 NOTE — Progress Notes (Signed)
will   SYMPTOM MANAGEMENT CLINIC   HPI: Jill Austin 68 y.o. female diagnosed with breast cancer.  Patient is status post lumpectomy and radiation therapy.  Currently undergoing anastrozole therapy.  Patient called the cancer Center today requesting urgent care visit.  She is complaining of 2 different rashes.  She states she has had a bilateral anterior shin rash consistently for approximately one year.  She denies any pruritus with the shin rash.  She's also complaining of intermittent rash to her bilateral breast/chest area.  She does complain of some trace pruritus to the chest wall rash.  She states she develops this rash to her chest approximate 4 times per year.  She states that she is concerned that this may be related to the anastrozole she takes on a daily basis.  She also states that she has been doing quite a bit of research on the Internet; is concerned that this could be an inflammatory type breast cancer.  She denies any pain, masses, or other changes with her bilateral breast.  She states that the rash to her chest is almost completely resolved at this point.  She denies any other new medications, lotions/shampoos, or other new products whatsoever that could be causing the rash.  She denies any recent fevers or chills.   HPI   ROS  Past Medical History  Diagnosis Date  . Breast cancer 05/08/12    invasive mammary ca, ER/PR+, HER 2 -  . Hypertension   . Arthritis   . GERD (gastroesophageal reflux disease)   . Wears glasses   . PONV (postoperative nausea and vomiting)   . S/P radiation therapy 4-12 wks ago 08/06/12-09/24/12    left breast    Past Surgical History  Procedure Laterality Date  . Dilation and curettage of uterus    . Hand osteoplasty  2007    left [facility]  . Colonoscopy    . Breast lumpectomy with needle localization and axillary sentinel lymph node bx Left 06/05/2012    Procedure: BREAST LUMPECTOMY WITH NEEDLE LOCALIZATION AND AXILLARY SENTINEL LYMPH NODE BX;   Surgeon: Haywood Lasso, MD;  Location: [facility];  Service: General;  Laterality: Left;  . Polypectomy      uterine    has Cancer of upper-inner quadrant of female breast; Mixed hyperlipidemia; Essential hypertension, benign; Esophageal reflux; Hot flashes related to aromatase inhibitor therapy; Arthralgia of both hands; and Rash on her problem list.     is allergic to penicillins and vicodin.    Medication List       This list is accurate as of: 03/17/14  5:53 PM.  Always use your most recent med list.               amLODipine 5 MG tablet  Commonly known as:  NORVASC  5 mg daily.     anastrozole 1 MG tablet  Commonly known as:  ARIMIDEX  Take 1 tablet (1 mg total) by mouth daily.     bisoprolol-hydrochlorothiazide 5-6.25 MG per tablet  Commonly known as:  ZIAC  1 tablet daily.     CALCIUM 600 + D PO  Take 600 mg by mouth daily.     fish oil-omega-3 fatty acids 1000 MG capsule  Take 2 g by mouth daily.     meloxicam 15 MG tablet  Commonly known as:  MOBIC  15 mg daily.     multivitamin capsule  Take 1 capsule by mouth daily.     omeprazole  20 MG capsule  Commonly known as:  PRILOSEC     pravastatin 10 MG tablet  Commonly known as:  PRAVACHOL  Take 20 mg by mouth daily.     sertraline 100 MG tablet  Commonly known as:  ZOLOFT  100 mg daily.         PHYSICAL EXAMINATION  Blood pressure 159/86, pulse 62, temperature 98.5 F (36.9 C), temperature source Oral, resp. rate 18, height 5\' 4"  (1.626 m), weight 174 lb 8 oz (79.153 kg), SpO2 99 %.  Physical Exam  Constitutional: She is oriented to person, place, and time and well-developed, well-nourished, and in no distress.  HENT:  Head: Normocephalic and atraumatic.  Eyes: Conjunctivae and EOM are normal. Pupils are equal, round, and reactive to light.  Neck: Normal range of motion. No JVD present. No tracheal deviation present.  Pulmonary/Chest: Effort normal. No respiratory distress.    Musculoskeletal: Normal range of motion. She exhibits no edema or tenderness.  Neurological: She is alert and oriented to person, place, and time. Gait normal.  Skin: Skin is warm and dry. Rash noted. No erythema.  Patient has a fine pink/red rash to her bilateral anterior shin.  No evidence of infection to rash.  Patient also has the outline of hives to her bilateral breast that does appear to be resolving.  No other rash noted on exam.  Psychiatric: Affect normal.  Nursing note and vitals reviewed.   LABORATORY DATA:. No visits with results within 3 Day(s) from this visit. Latest known visit with results is:  Appointment on 12/12/2013  Component Date Value Ref Range Status  . WBC 12/12/2013 6.4  3.9 - 10.3 10e3/uL Final  . NEUT# 12/12/2013 4.4  1.5 - 6.5 10e3/uL Final  . HGB 12/12/2013 13.0  11.6 - 15.9 g/dL Final  . HCT 12/12/2013 40.4  34.8 - 46.6 % Final  . Platelets 12/12/2013 184  145 - 400 10e3/uL Final  . MCV 12/12/2013 89.8  79.5 - 101.0 fL Final  . MCH 12/12/2013 28.8  25.1 - 34.0 pg Final  . MCHC 12/12/2013 32.1  31.5 - 36.0 g/dL Final  . RBC 12/12/2013 4.50  3.70 - 5.45 10e6/uL Final  . RDW 12/12/2013 14.1  11.2 - 14.5 % Final  . lymph# 12/12/2013 1.4  0.9 - 3.3 10e3/uL Final  . MONO# 12/12/2013 0.4  0.1 - 0.9 10e3/uL Final  . Eosinophils Absolute 12/12/2013 0.1  0.0 - 0.5 10e3/uL Final  . Basophils Absolute 12/12/2013 0.0  0.0 - 0.1 10e3/uL Final  . NEUT% 12/12/2013 68.6  38.4 - 76.8 % Final  . LYMPH% 12/12/2013 21.9  14.0 - 49.7 % Final  . MONO% 12/12/2013 6.9  0.0 - 14.0 % Final  . EOS% 12/12/2013 2.0  0.0 - 7.0 % Final  . BASO% 12/12/2013 0.6  0.0 - 2.0 % Final  . Sodium 12/12/2013 141  136 - 145 mEq/L Final  . Potassium 12/12/2013 3.9  3.5 - 5.1 mEq/L Final  . Chloride 12/12/2013 106  98 - 109 mEq/L Final  . CO2 12/12/2013 27  22 - 29 mEq/L Final  . Glucose 12/12/2013 91  70 - 140 mg/dl Final  . BUN 12/12/2013 17.0  7.0 - 26.0 mg/dL Final  . Creatinine  12/12/2013 0.9  0.6 - 1.1 mg/dL Final  . Total Bilirubin 12/12/2013 0.84  0.20 - 1.20 mg/dL Final  . Alkaline Phosphatase 12/12/2013 78  40 - 150 U/L Final  . AST 12/12/2013 18  5 - 34 U/L Final  .  ALT 12/12/2013 12  0 - 55 U/L Final  . Total Protein 12/12/2013 7.0  6.4 - 8.3 g/dL Final  . Albumin 12/12/2013 4.0  3.5 - 5.0 g/dL Final  . Calcium 12/12/2013 9.1  8.4 - 10.4 mg/dL Final  . Anion Gap 12/12/2013 9  3 - 11 mEq/L Final     RADIOGRAPHIC STUDIES: No results found.  ASSESSMENT/PLAN:    Arthralgia of both hands Patient has noticed some arthralgias of her bilateral hands since initiating the anastrozole in July 2014.  She does take Mobic on an occasional basis for the discomfort.  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  She initiated anastrozole daily therapy in July 2014.  She will continue as previously directed.  She is scheduled for her next lab draw and follow-up visit here at the [facility] on 06/19/2014.  Patient is scheduled for her next mammogram in February 2016.  She is scheduled for her next bone scan in April 2016.  Hot flashes related to aromatase inhibitor therapy Patient does continue to complain of some chronic, intermittent hot flashes.  Most likely, this is secondary to the anastrozole.  Patient states that they are tolerable at present.  Rash Patient does have a chronic rash to her anterior bilateral shins for the past year.  She states that this rash does not itch or cause any type of discomfort whatsoever.  Patient has trace outline of rash to her bilateral breast/anterior chest.  Patient does complain of some pruritus to the chest wall rash.  Rash does appear mild; and slowly resolving.  There is no other new rash noted on exam.  Advised patient that she could try both Benadryl 25 mg every 6 hours and Pepcid 20 mg every 12 hours to see if this helps.  Also advised patient would review  patient's intermittent rash to her chest with Dr. Jana Hakim to see if he thinks this could be related to the anastrozole.  Advised patient we'll call her back first thing in the morning regarding follow-up planned.  Also reviewed with patient that she may very well need a dermatology consult for further evaluation as well.  Patient stated understanding of all instructions; and was in agreement with this plan of care. The patient knows to call the clinic with any problems, questions or concerns.   Review/collaboration with Dr. Jana Hakim regarding all aspects of patient's visit today.   Total time spent with patient was 25 minutes;  with greater than 75 percent of that time spent in face to face counseling regarding her symptoms and coordination of care and follow up.  Disclaimer: This note was dictated with voice recognition software. Similar sounding words can inadvertently be transcribed and may not be corrected upon review.   Drue Second, NP 03/17/2014

## 2014-03-17 NOTE — Assessment & Plan Note (Signed)
Patient is status post left lumpectomy and radiation therapy.  She initiated anastrozole daily therapy in July 2014.  She will continue as previously directed.  She is scheduled for her next lab draw and follow-up visit here at the [facility] on 06/19/2014.  Patient is scheduled for her next mammogram in February 2016.  She is scheduled for her next bone scan in April 2016.

## 2014-03-17 NOTE — Telephone Encounter (Signed)
Pt called to this RN to state onset " now the 4th time this year that I develop welts that itch on both my breast ".  Per discussion pt denies any changes in detergent and " I am wearing the same bras ".  She also states she has a rash on both shins " that doesn't itch but also doesn't seem to go away "  " do you think these rashes are caused by the arimidex ".  Layney has used hydrocortisone cream on shins with no noted benefit.  This RN discussed with pt usual presentation of allergic reactions to medications-  Plan per call is pt will come in to our symptom management for assessment.

## 2014-03-17 NOTE — Assessment & Plan Note (Signed)
Patient does continue to complain of some chronic, intermittent hot flashes.  Most likely, this is secondary to the anastrozole.  Patient states that they are tolerable at present.

## 2014-03-17 NOTE — Progress Notes (Signed)
Itching on medial breasts, since Saturday, has had about 4 times is past year but went away in about 2-3 days. This time was on computer and saw inflammatory breast disease, also son-in-laws mother died from this. Increased concern.  Rash on ant shins not itching, had for about 1 year. Cortisone cream does not help. Will dry up and fade and then come back.

## 2014-03-18 ENCOUNTER — Other Ambulatory Visit: Payer: Self-pay | Admitting: Nurse Practitioner

## 2014-03-18 ENCOUNTER — Other Ambulatory Visit: Payer: Self-pay | Admitting: *Deleted

## 2014-03-18 ENCOUNTER — Other Ambulatory Visit: Payer: Self-pay | Admitting: Oncology

## 2014-03-18 DIAGNOSIS — C50212 Malignant neoplasm of upper-inner quadrant of left female breast: Secondary | ICD-10-CM

## 2014-03-18 DIAGNOSIS — R21 Rash and other nonspecific skin eruption: Secondary | ICD-10-CM

## 2014-03-18 MED ORDER — ANASTROZOLE 1 MG PO TABS: 1.0000 mg | ORAL_TABLET | Freq: Every day | ORAL | Status: DC

## 2014-05-29 ENCOUNTER — Telehealth: Payer: Self-pay | Admitting: *Deleted

## 2014-05-29 NOTE — Telephone Encounter (Signed)
Call received requesting diagnostic mammogram order for this patient scheduled today.  [facility] has received fax and will send.

## 2014-06-16 ENCOUNTER — Telehealth: Payer: Self-pay | Admitting: *Deleted

## 2014-06-16 DIAGNOSIS — C50212 Malignant neoplasm of upper-inner quadrant of left female breast: Secondary | ICD-10-CM

## 2014-06-16 MED ORDER — ANASTROZOLE 1 MG PO TABS: 1.0000 mg | ORAL_TABLET | Freq: Every day | ORAL | Status: DC

## 2014-06-16 NOTE — Telephone Encounter (Signed)
Reports 10 pills left.  Has new insurance and new drug company.  Now needs orders sent to [facility].  Reports she called to cancel 06-19-2014 appointment because her daughter has been diagnosed with a brain tumor and she will have surgery 06-18-2014.  I will be with her as long as I need to so I will call to reschedule this appointment."

## 2014-06-16 NOTE — Telephone Encounter (Signed)
Please refill medication as requested.  Thank you  GM

## 2014-06-17 ENCOUNTER — Other Ambulatory Visit: Payer: Self-pay | Admitting: *Deleted

## 2014-06-19 ENCOUNTER — Ambulatory Visit: Payer: Medicare Other | Admitting: Oncology

## 2014-06-19 ENCOUNTER — Other Ambulatory Visit: Payer: Medicare Other

## 2014-07-14 ENCOUNTER — Telehealth: Payer: Self-pay | Admitting: Nurse Practitioner

## 2014-07-14 NOTE — Telephone Encounter (Signed)
pt cld to r/s appt-gave pt time & date-pt understood

## 2014-07-14 NOTE — Telephone Encounter (Signed)
per pof ot sch pt appt-gave pt copy of sch °

## 2014-07-21 ENCOUNTER — Telehealth: Payer: Self-pay | Admitting: Oncology

## 2014-07-21 NOTE — Telephone Encounter (Signed)
pt cld to r/s-req to see GM-*gave pt updated appt time & date

## 2014-07-29 ENCOUNTER — Other Ambulatory Visit: Payer: Self-pay

## 2014-07-29 ENCOUNTER — Ambulatory Visit: Payer: Self-pay | Admitting: Nurse Practitioner

## 2014-10-19 NOTE — Progress Notes (Signed)
ID: [name]   DOB: 1945-11-24  MR#: [id number]  HFW#:[id number]  PCP: Jonathon Bellows, MD GYN: Elyse Hsu SU: Osborn Coho OTHER MD: Thea Silversmith, Johnnette Gourd, Luberta Mutter, Lorrene Reid  CHIEF COMPLAINT:  Left Breast Cancer  CURRENT TREATMENT: Aromatase inhibitors  HISTORY OF PRESENT ILLNESS: From the original intake note:  Jill Austin had routine screening mammography at [facility] 05/05/2011, showing heterogeneously dense breasts. There were no findings of concern. On 05/07/2012, a new spiculated mass was noted in the left breast, and the patient was recalled for left breast ultrasound 05/08/2012. This confirmed a hypoechoic mass measuring 1.3 cm, at the 11:00 position 11 cm from the nipple. The left axilla was unremarkable.  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  The patient's subsequent history is as detailed below.  INTERVAL HISTORY: Jill Austin returns today for follow up of her breast cancer. She has been under a great deal of stress because her daughter in [place] was found to have a brain tumor which fortunately turned out to be benign. Quite aside from those issues, Jill Austin continues to have arthralgias and myalgias, hives and a rash, and feels like she is getting demented. She will start a sentence and not finish that she says. She is on Zoloft and Xanax. However she feels the anastrozole is the likely culprit for all her symptoms.  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 Tye Savoy to get that repaired. She has a ringing in her years. She has stress urinary incontinence. She feels anxious and depressed. A detailed review of systems today was otherwise stable A detailed review of systems was entirely negative besides  as noted above.   PAST MEDICAL HISTORY: Past Medical History  Diagnosis Date  . Breast cancer 05/08/12    invasive mammary ca, ER/PR+, HER 2 -  . Hypertension   . Arthritis   . GERD (gastroesophageal reflux disease)   . Wears glasses   . PONV (postoperative nausea and vomiting)   . 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 Dilation and curettage of uterus    . Hand osteoplasty  2007    left [facility]  . Colonoscopy    . Breast lumpectomy with needle localization and axillary sentinel lymph node bx Left 06/05/2012    Procedure: BREAST LUMPECTOMY WITH NEEDLE LOCALIZATION AND AXILLARY SENTINEL LYMPH NODE BX;  Surgeon: Haywood Lasso, MD;  Location: [facility];  Service: General;  Laterality: Left;  . Polypectomy      uterine  Hand surgery under Ardine Bjork Removal of uterine polyp  FAMILY HISTORY No family history on file. The patient's father died at the age of 40 from heart problems. The patient's mother died at the age of 51, from heart problems. Jill Austin had one brother, no sisters. There is no history of breast or ovarian cancer in the family to her knowledge.  GYNECOLOGIC HISTORY: Menarche age 58, first live birth age 60, she is [place] P2, last menstrual period approximately 1998. She took hormone replacement for approximately 7 years.  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 Her husband Jill Austin (goes by [employer] is retired from [employer]. He is having significant spine problems. Their daughter Jill Austin teaches 68-year-olds in [place] in [place]; daughter Jill Austin lives in [place] and is a homemaker. The patient has 4 grandchildren. She attends the [employer] of  [place]: In place  HEALTH MAINTENANCE: (Updated 03/13/2013) History  Substance Use Topics  . Smoking status: Never Smoker   . Smokeless tobacco: Never Used  . Alcohol Use: 6.0 oz/week    10 Glasses of wine per week    Colonoscopy: 2014, [facility]  PAP: 2011  Bone density: 07/25/2012 [facility]: osteopenia, T - 1.8  Lipid panel: Dr. Jonny Ruiz    Allergies  Allergen Reactions  . Penicillins Other (See Comments)    headache  . Vicodin [Hydrocodone-Acetaminophen] Other (See Comments)    Hyper     Current Outpatient Prescriptions  Medication Sig Dispense Refill  . amLODipine (NORVASC) 5 MG tablet 5 mg daily.     . bisoprolol-hydrochlorothiazide (ZIAC) 5-6.25 MG per tablet 1 tablet daily.     . Calcium Carbonate-Vitamin D (CALCIUM 600 + D PO) Take 600 mg by mouth daily.    . fish oil-omega-3 fatty acids 1000 MG capsule Take 2 g by mouth daily.    . meloxicam (MOBIC) 15 MG tablet 15 mg daily.     . Multiple Vitamin (MULTIVITAMIN) capsule Take 1 capsule by mouth daily.    Marland Kitchen omeprazole (PRILOSEC) 20 MG capsule     . pravastatin (PRAVACHOL) 10 MG tablet Take 20 mg by mouth daily.    . sertraline (ZOLOFT) 100 MG tablet 100 mg daily.      No current facility-administered medications for this visit.    OBJECTIVE: Middle-aged white woman who appears stated age 69 Vitals:   10/20/14 1609  BP: 157/96  Pulse: 81  Temp: 97.8 F (36.6 C)  Resp: 18     Body mass index is 30.66 kg/(m^2).    ECOG FS: 1 Filed Weights   10/20/14 1609  Weight: 178 lb 11.2 oz (81.058 kg)   Sclerae unicteric, EOMs intact Oropharynx clear, dentition in good repair No cervical or supraclavicular adenopathy Lungs no rales or rhonchi Heart regular rate and rhythm Abd soft, nontender, positive bowel sounds MSK no focal spinal tenderness, no upper extremity lymphedema Neuro: nonfocal, well oriented, appropriate affect Breasts: The right breast is unremarkable. The left breast is status  post lumpectomy and radiation. There is no evidence of local recurrence. The left axilla is benign.   LAB RESULTS: Lab Results  Component Value Date   WBC 6.2 10/20/2014   NEUTROABS 4.2 10/20/2014   HGB 13.6 10/20/2014   HCT 41.4 10/20/2014   MCV 88.1 10/20/2014   PLT 190 10/20/2014      Chemistry      Component Value Date/Time   NA 140 10/20/2014 1521   NA 139 06/04/2012 1200   K 3.9 10/20/2014 1521   K 3.8 06/04/2012 1200   CL 103 06/04/2012 1200   CL 103 05/16/2012 0849   CO2 26 10/20/2014 1521   CO2 26 06/04/2012 1200   BUN 17.2 10/20/2014 1521   BUN 22 06/04/2012 1200   CREATININE 0.8 10/20/2014 1521   CREATININE 0.79 06/04/2012 1200      Component Value Date/Time   CALCIUM 9.3 10/20/2014 1521   CALCIUM 9.2 06/04/2012 1200   ALKPHOS 76 10/20/2014 1521   AST  20 10/20/2014 1521   ALT 15 10/20/2014 1521   BILITOT 1.08 10/20/2014 1521       STUDIES: No results found.  ASSESSMENT: 69 y.o. [place] woman status post left lumpectomy and sentinel lymph node sampling 06/05/2012 for a mpT1c pN0, stage IA invasive ductal carcinoma, grade 3, estrogen and progesterone receptor positive both at 100%, HER-2/neu negative, with an MIB-1 of 12%.  (1) Oncotype DX showed a score of 14, predicting a distant recurrence risk of 9% within the next 10 years if the patient's only systemic treatment is tamoxifen for 5 years  (2) adjuvant radiation, completed 09/19/2012   (3) Started anastrozole July 2014, discontinued July 2016 with multiple side effects.  (4) osteopenia with a T score of -1.8 on bone scan April 2014, -1.9 05/29/2014, at [facility]:  Jill Austin is absolutely convinced that her rash and other problems is due to the anastrozole. I haven't absolutely no problem with her stopping anastrozole at this point. She has already taken it for 2 years and this is a very good time to consider switching to tamoxifen.  I gave her written information on the difference between  tamoxifen and anastrozole that she understands that tamoxifen will help her bone density but can cause problems with clots and endometrial thickening, polyps, or even endometrial carcinoma. She did take estrogen replacement for 2 years with no clotting issue so I suspect clots will not be a major problem.  I asked her to call me after Labor Day to tell me how she is doing off anastrozole. If she is just as uncomfortable as now then we might as well stay on anastrozole, but otherwise I will call tamoxifen in for her at that time and she will see me again in early December to discuss that she tolerates that.  She wanted to know what might happen if she did not take anything. That is difficult to calculate but I would say she would have an approximately 16% chance of the cancer recurring versus a very low chance, around 6% if she does manage to take anti-estrogens for 5 years.  I think it would be a good idea for her to start an exercise program and I gave her the [place] pamphlet.  She knows to call for any problems that may develop before her next visit here.  Chauncey Cruel, MD     10/20/2014

## 2014-10-20 ENCOUNTER — Telehealth: Payer: Self-pay | Admitting: Oncology

## 2014-10-20 ENCOUNTER — Ambulatory Visit (HOSPITAL_BASED_OUTPATIENT_CLINIC_OR_DEPARTMENT_OTHER): Payer: Medicare Other | Admitting: Oncology

## 2014-10-20 ENCOUNTER — Other Ambulatory Visit (HOSPITAL_BASED_OUTPATIENT_CLINIC_OR_DEPARTMENT_OTHER): Payer: Medicare Other

## 2014-10-20 VITALS — BP 157/96 | HR 81 | Temp 97.8°F | Resp 18 | Ht 64.0 in | Wt 178.7 lb

## 2014-10-20 DIAGNOSIS — Z170 Estrogen receptor positive status [ER+]: Secondary | ICD-10-CM

## 2014-10-20 DIAGNOSIS — M8580 Other specified disorders of bone density and structure, unspecified site: Secondary | ICD-10-CM | POA: Diagnosis not present

## 2014-10-20 DIAGNOSIS — C50212 Malignant neoplasm of upper-inner quadrant of left female breast: Secondary | ICD-10-CM

## 2014-10-20 LAB — CBC WITH DIFFERENTIAL/PLATELET
BASO%: 0.6 % (ref 0.0–2.0)
Basophils Absolute: 0 10*3/uL (ref 0.0–0.1)
EOS ABS: 0.1 10*3/uL (ref 0.0–0.5)
EOS%: 2.2 % (ref 0.0–7.0)
HCT: 41.4 % (ref 34.8–46.6)
HEMOGLOBIN: 13.6 g/dL (ref 11.6–15.9)
LYMPH%: 22.1 % (ref 14.0–49.7)
MCH: 28.9 pg (ref 25.1–34.0)
MCHC: 32.9 g/dL (ref 31.5–36.0)
MCV: 88.1 fL (ref 79.5–101.0)
MONO#: 0.5 10*3/uL (ref 0.1–0.9)
MONO%: 8.4 % (ref 0.0–14.0)
NEUT%: 66.7 % (ref 38.4–76.8)
NEUTROS ABS: 4.2 10*3/uL (ref 1.5–6.5)
PLATELETS: 190 10*3/uL (ref 145–400)
RBC: 4.7 10*6/uL (ref 3.70–5.45)
RDW: 13.7 % (ref 11.2–14.5)
WBC: 6.2 10*3/uL (ref 3.9–10.3)
lymph#: 1.4 10*3/uL (ref 0.9–3.3)

## 2014-10-20 LAB — COMPREHENSIVE METABOLIC PANEL (CC13)
ALK PHOS: 76 U/L (ref 40–150)
ALT: 15 U/L (ref 0–55)
AST: 20 U/L (ref 5–34)
Albumin: 4.1 g/dL (ref 3.5–5.0)
Anion Gap: 9 mEq/L (ref 3–11)
BUN: 17.2 mg/dL (ref 7.0–26.0)
CALCIUM: 9.3 mg/dL (ref 8.4–10.4)
CO2: 26 mEq/L (ref 22–29)
CREATININE: 0.8 mg/dL (ref 0.6–1.1)
Chloride: 105 mEq/L (ref 98–109)
EGFR: 75 mL/min/{1.73_m2} — AB (ref >90)
Glucose: 111 mg/dl (ref 70–140)
Potassium: 3.9 mEq/L (ref 3.5–5.1)
SODIUM: 140 meq/L (ref 136–145)
Total Bilirubin: 1.08 mg/dL (ref 0.20–1.20)
Total Protein: 6.9 g/dL (ref 6.4–8.3)

## 2014-10-20 NOTE — Telephone Encounter (Signed)
Gave avs & calendar for December. Patient request labs same day as MD visit.

## 2015-01-20 ENCOUNTER — Ambulatory Visit: Payer: Medicare Other | Attending: Psychology | Admitting: Psychology

## 2015-01-20 DIAGNOSIS — F908 Attention-deficit hyperactivity disorder, other type: Secondary | ICD-10-CM | POA: Diagnosis not present

## 2015-01-21 NOTE — Progress Notes (Addendum)
[facility]  [address]   Telephone [phone] [address] Fax [phone] [place], [place] [address] Licensed Psychologist  NEUROPSYCHOLOGICAL EVALUATION  *CONFIDENTIAL* This report should not be released without the consent of the client  Name:   Jill Austin. Jill Austin Date of Birth:  [birth year] [facility] MR#:  [id number] Date of Evaluation: 01/20/15   Reason for Referral Jill Austin is a 69 year-old right-handed woman who was referred for neuropsychological evaluation by Maurice Small, MD with [place] at [facility]. Jill Austin had reported increasing difficulties with focus, memory and word-finding.  Sources of Information Medical records from Dr. Justin Mend and electronic medical records of the [facility] were reviewed. Jill Austin was interviewed.    Chief Complaints She reported that for most of her adult life she has been prone to have difficulties finding words, not finish sentences, change topics abruptly and interrupt others. She also described herself as being easily distracted, having difficulty persisting to boring or repetitive tasks and being hyper focused if the task was of interest to her. She reported that she has tended to speak at a rapid pace, fidget while seated or inactive, experience racing thoughts and be impatient. She stated her belief that these behaviors or problems have seemed to intensify since she retired in 2008. Of late, friends and family have questioned her cognitive functioning. She reported no problems performing her basic or instrumental activities of daily living.   She did not report experiencing problems with memory, sequencing or organization. She did not report any changes in vision or hearing. She reported that her sleeping habits have become irregular since she retired as she will often go to bed at 2 a.m. and sleep until late in the morning. She did not report any problems with daytime alertness.  She was  not aware of any changes in her mood, personality or habits within the past few years. While she did not report being depressed, she acknowledged having experienced occasional several day long periods of feeling "non-productive" during which she does not get dressed or go out. On most days, however, she has felt in good spirits and has enjoyed social activities She reported a relatively high level of life stress recently as her daughter was diagnosed with a brain tumor in early 2016 which fortunately turned out to be benign and her husband has undergone multiple back surgeries. She denied feeling anxious though has sometimes found herself clenching her teeth while driving. She denied having negative thoughts about herself or the future, suicidal thoughts, hallucinations or delusions. She is not aware of having exhibited any problems with mood stability, social comportment, impulse control or everyday judgment.  Background Her past medical history was notable hyperlipidemia, hypertension, gastroesophageal reflux disease, osteoarthritis of the hands and osteopenia. 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  She denied history of head injury, seizure activity, stroke-like symptoms, neurological infection or exposure to neurotoxic substances.   She reported moderate consumption of alcohol (wine) in social contexts. She denied use of illicit drugs or tobacco products.  She has been taking sertraline since 2004 soon after she began to have "panic attacks" shortly after her husband lost his job. She reported that she has not had an anxiety attack since starting on sertraline. She reported no history of mental health contacts.  Her current medications include amlodipine, hydrochlorothiazide,  meloxicam, omeprazole, pravastatin and sertraline.  Her family  medical history was notable for her report that her mother was an alcoholic. She was not aware of any other psychiatric disorders in her family. She reported no history of memory loss or dementia amongst her immediate family members.  She lives with her husband. They have two adult daughters.   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   With regards to her educational history, she was graduated from high school and attended one year of college before dropping out to get married. She described herself as a below average student in high school who was prone to talk excessively. She could not recall whether she was inattentive or overactive. In contrast, she stated she did well in her only year of college.  Evaluation Procedures Adult ADHD Self-Report Scale Symptom Checklist  Animal Naming Test  [facility] Naming Test Controlled Oral Word Association Test Geriatric Anxiety Scale  Geriatric Depression Scale  Rey Complex Figure: Copy Trail Making A & B Wechsler Adult Intelligence Scale-IV:   Music therapist, Coding, Digit Span, Matrix Reasoning & Similarities Wechsler Memory Scale-IV: Older Adult Battery Wide Range Achievement Test-4: Word Reading  Symptom Inventories/Questionnaires On the Adult ADHD Self-Report Scale Symptom Checklist, which lists 18 symptoms matching DSM-IV criteria for Attention Deficit Hyperactivity  Disorder (ADHD), her endorsement of four of the six items purportedly most predictive of ADHD to a relatively high frequency was highly consistent with Adult ADHD. She reported that at least often within the past six months she has experienced symptoms of inattentiveness (e.g., being easily distracted, difficulty maintain attention to boring or repetitive tasks) and hyperactivity (e.g., tendency to fidget or squirm, interrupting others, talking excessively).   On the Geriatric Depression Scale (short form), her score of 2/15 was not suggestive of  depression. The two items she endorsed were feeling worthless (due to being overweight) and lacking energy (due to back pain).   Her score of 12 on the Geriatric Anxiety Scale fell within the normal range. The only symptoms that she endorsed as occurring "most of the time" were difficulty falling sleep and having a hard time sitting still.  Test Results Test Validity & Interpretive Considerations It was concluded that the test results represented a valid measure of her current cognitive functioning. She did not report or display problems with vision (she wore her eyeglasses), hearing or motor skills. She was able to understand task directions without difficulty. She appeared to expend maximal effort.   Her baseline intellectual potential was estimated to fall within the Average range based on demographic factors coupled with a measure of word reading (Wide Range Achievement Test-4) that represents an over-learned verbal skill relatively resistant to the effects of neurological disorder or injury.   Her test scores were corrected to reflect norms for her age and whenever possible, her gender and educational level (i.e., 13 years). A listing of test results can be found at the end of this report.  Speed of Processing & Attention Her speed of processing was within normal limits based on her speed to transcribe symbols to match digits using a key (Wechsler Adult Intelligence Scale-IV (WAIS-IV) Coding) or to draw lines to connect randomly arrayed numbers in sequence (Trails A).  Likewise, her attentional capacity to encode, hold and manipulate information in temporary memory was within normal limits based on her abilities to mentally rearrange digits in reverse or ascending order (WAIS-IV Digit Span) or immediately recognize symbols in left to  right order (Wechsler Memory Scale-IV (WMS-IV) Symbol Span).    Learning & Memory Learning and retention were relative strengths. Her immediate recall of verbal and  visual information (WMS-IV Immediate Memory Index) fell within the Superior range at the 93rd percentile. Acquisition of new auditory-verbal information was particularly strong as her immediate auditory memory subtest scores fell within the Very Superior range. Her delayed memory (WMS-IV Delayed Memory Index) fell within the Superior range at the 96th percentile. Her Delayed Memory Index was better than expected given her Immediate Memory Index, which indicated that she benefitted from the extra time of the delay interval to more fully consolidate information into memory.  Language Naming to confrontation [facility] [employer]) was well within normal expectations. Measures of phonemic (Controlled Oral Word Association Test) and semantic fluency (Animal Naming Test) fell within the Average range. Her ability to read words (Wide Range Achievement Test-4: Word Reading) fell within the Average range.   Visual-Spatial Perception & Organization There were no signs of spatial inattention or problems with visual recognition. Her score on a test of visuospatial organization that required assembly of two-dimensional block designs from models (WAIS-IV Block Design) fell within the Average range. Her drawing of a spatially complex geometric figure (Rey Complex Figure) was normal though marred by slight misplacements and one omission that were  presumed due to her hasty approach.    Quarry manager functions comprise higher level attentional, organizational and reasoning processes that enable a person to successfully initiate, monitor, shift, plan and execute complex behavior. As noted above, her ability to hold and manipulate information in working memory was within normal expectations. She completed an attentional test that required visual sequencing and ongoing set shifting (Trails B) in a slightly above average amount of time. Her abilities to generate words to designated letters (Controlled Oral Word  Association Test) or name members of a category (Animal Naming Test) were within the Average range.  Her conceptual reasoning abilities involving both verbal (WAIS-IV Similarities) and nonverbal (WAIS-IV Matrix Reasoning) information fell within the Average range.     Summary & Conclusions Overall, Jill Austin performed within normal expectations on the neuropsychological testing battery. Of note, measures of memory functioning were relative strengths within the Superior to Very Superior ranges. While formal measures of attentional capacity were within normal limits, her drawing of a complex geometric design was notable for inattentiveness to visual detail that was likely due to her hurried approach. Indeed, observations of this pleasant and cheerful woman were notable for her being fidgety and speaking at a slightly pressured rate. She denied current problems with mood or psychosocial adjustment.   In conclusion, there were no indications of cognitive disorder. Her self-report of a several year history of inattentiveness and hyperactivity coupled with clinical observations would be suggestive of Adult ADHD. While it was not clear whether she had experienced ADHD symptoms as a child, she reported having displayed such symptoms throughout adulthood. Her ADHD symptoms may have become more apparent or problematic as the structure in her daily life lessened after she retired. With regards to other possible causes of cognitive decrement, it appears unlikely that her cognitive complaints are secondary to use of anastrozole to treat breast cancer given that her cognitive complaints and in some form pre-dated use of anastrozole; and she did not demonstrate evidence of cognitive decrement on neuropsychological testing. Finally, it is possible that anxiety or life stresses are modifying factors that further degrade her attention and exacerbates her overactive behavior.  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 Leading a more structured lifestyle would likely help her to better focus, follow-through and minimize procrastination. She was advised to create a daily routine using scheduled activities and to-do lists that might recapture  some of the structure she had when she was working.   2. A trial on a psychostimulant medication, if medically appropriate, could be considered. She was advised to discuss this with her primary care physician.   I have appreciated the opportunity to evaluate Ms. Jill Austin. The results and recommendations from this evaluation were discussed with her on 01/23/15. Please feel free to contact me with any comments or questions.    ______________________ Jamey Ripa, Ph.D Licensed Psychologist                  ADDENDUM-NEUROPSYCHOLOGICAL TEST RESULTS  Animal Naming Test Score= 62 55th (adjusted for age, gender and educational level)   [place] Naming Test Score=58/60 94th (adjusted for age, gender and educational level)   Controlled Oral Word Association Test Score=  68 words/0 repetition 55th (adjusted for age, gender and educational level)   Rey Complex Figure: copy       Score= 29/36  WNL but misplacements and one omission suggestive of inattentiveness to detail    Trails A Score=   36s  2e 45th  (adjusted for age, gender and educational level)  Trails B Score= 122s 1e 21st  (adjusted for age, gender and educational level)   Wechsler Adult Intelligence Scale-IV   Subtest Scaled Score Percentile  Block Design   9 37th     Similarities   9 37th    Digit Span  Forward               Backward               Sequencing 13 16 13 10  84th        98th   84th     50th      Matrix Reasoning 12 75th     Coding     9 37th       Wechsler Memory Scale-IV Older Adult Battery  Index Index Score Percentile  Immediate Memory 122 93rd    Auditory Memory 137 99th      Visual Memory   95  37th    Delayed Memory 127 96th      Symbol Span Scaled score= 12 75th       Wide Range Achievement Test-4 Subtest  Raw score Standard score Percentile  Word Reading 61/70  102 55th

## 2015-01-26 ENCOUNTER — Encounter: Payer: Self-pay | Admitting: Psychology

## 2015-03-12 ENCOUNTER — Other Ambulatory Visit: Payer: Self-pay

## 2015-03-12 DIAGNOSIS — C50212 Malignant neoplasm of upper-inner quadrant of left female breast: Secondary | ICD-10-CM

## 2015-03-16 ENCOUNTER — Ambulatory Visit (HOSPITAL_BASED_OUTPATIENT_CLINIC_OR_DEPARTMENT_OTHER): Payer: Medicare Other | Admitting: Oncology

## 2015-03-16 ENCOUNTER — Telehealth: Payer: Self-pay | Admitting: Oncology

## 2015-03-16 ENCOUNTER — Encounter: Payer: Self-pay | Admitting: Oncology

## 2015-03-16 ENCOUNTER — Other Ambulatory Visit (HOSPITAL_BASED_OUTPATIENT_CLINIC_OR_DEPARTMENT_OTHER): Payer: Medicare Other

## 2015-03-16 VITALS — BP 136/87 | HR 79 | Temp 98.1°F | Resp 18 | Ht 64.0 in | Wt 176.9 lb

## 2015-03-16 DIAGNOSIS — M8580 Other specified disorders of bone density and structure, unspecified site: Secondary | ICD-10-CM | POA: Diagnosis not present

## 2015-03-16 DIAGNOSIS — C50212 Malignant neoplasm of upper-inner quadrant of left female breast: Secondary | ICD-10-CM

## 2015-03-16 DIAGNOSIS — Z170 Estrogen receptor positive status [ER+]: Secondary | ICD-10-CM

## 2015-03-16 LAB — COMPREHENSIVE METABOLIC PANEL
ALK PHOS: 77 U/L (ref 40–150)
ALT: 16 U/L (ref 0–55)
ANION GAP: 9 meq/L (ref 3–11)
AST: 21 U/L (ref 5–34)
Albumin: 3.9 g/dL (ref 3.5–5.0)
BILIRUBIN TOTAL: 1.23 mg/dL — AB (ref 0.20–1.20)
BUN: 24.3 mg/dL (ref 7.0–26.0)
CALCIUM: 9 mg/dL (ref 8.4–10.4)
CHLORIDE: 106 meq/L (ref 98–109)
CO2: 25 meq/L (ref 22–29)
CREATININE: 0.8 mg/dL (ref 0.6–1.1)
EGFR: 72 mL/min/{1.73_m2} — AB (ref >90)
Glucose: 85 mg/dl (ref 70–140)
Potassium: 3.7 mEq/L (ref 3.5–5.1)
Sodium: 140 mEq/L (ref 136–145)
Total Protein: 7 g/dL (ref 6.4–8.3)

## 2015-03-16 LAB — CBC WITH DIFFERENTIAL/PLATELET
BASO%: 0.4 % (ref 0.0–2.0)
BASOS ABS: 0 10*3/uL (ref 0.0–0.1)
EOS ABS: 0.1 10*3/uL (ref 0.0–0.5)
EOS%: 1.8 % (ref 0.0–7.0)
HEMATOCRIT: 41.4 % (ref 34.8–46.6)
HGB: 13.3 g/dL (ref 11.6–15.9)
LYMPH#: 1.4 10*3/uL (ref 0.9–3.3)
LYMPH%: 22.1 % (ref 14.0–49.7)
MCH: 28.4 pg (ref 25.1–34.0)
MCHC: 32.1 g/dL (ref 31.5–36.0)
MCV: 88.3 fL (ref 79.5–101.0)
MONO#: 0.5 10*3/uL (ref 0.1–0.9)
MONO%: 7.3 % (ref 0.0–14.0)
NEUT#: 4.3 10*3/uL (ref 1.5–6.5)
NEUT%: 68.4 % (ref 38.4–76.8)
PLATELETS: 188 10*3/uL (ref 145–400)
RBC: 4.69 10*6/uL (ref 3.70–5.45)
RDW: 13.9 % (ref 11.2–14.5)
WBC: 6.3 10*3/uL (ref 3.9–10.3)

## 2015-03-16 MED ORDER — TAMOXIFEN CITRATE 20 MG PO TABS: 20.0000 mg | ORAL_TABLET | Freq: Every day | ORAL | Status: DC

## 2015-03-16 NOTE — Telephone Encounter (Signed)
Appointments made and avs pritned for patient °

## 2015-03-16 NOTE — Progress Notes (Signed)
ID: Jill Austin   DOB: April 01, 1946  MR#: [id number]  MEQ#:[id number]  PCP: Jonathon Bellows, MD GYN: Elyse Hsu SU: Osborn Coho OTHER MD: Thea Silversmith, Johnnette Gourd, Luberta Mutter, Lorrene Reid  CHIEF COMPLAINT:  Left Breast Cancer  CURRENT TREATMENT: tamoxifen  BREAST CANCER HISTORY: From the original intake note:  Jill Austin had routine screening mammography at [facility] 05/05/2011, showing heterogeneously dense breasts. There were no findings of concern. On 05/07/2012, a new spiculated mass was noted in the left breast, and the patient was recalled for left breast ultrasound 05/08/2012. This confirmed a hypoechoic mass measuring 1.3 cm, at the 11:00 position 11 cm from the nipple. The left axilla was unremarkable.  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  The patient's subsequent history is as detailed below.  INTERVAL HISTORY: Jill Austin returns today for follow up of her breast cancer. She stopped anastrozole at her last visit in July due to multiple side effects including rash, arthralgias, and "dementia" or trouble focusing. She rash and arthralgias have improved, but she discovered her trouble focusing was actually due to ADHD versus a cognitive decline after visiting neuropsychologist Dr. Valentina Shaggy. She is here to discuss beginning tamoxifen as described in Dr. Virgie Dad last progress note.   REVIEW OF SYSTEMS: Jill Austin had left cataract surgery just recently and is still recovering from that. Her vision has not had a chance to significantly improve yet. She has an "average" level of aches and pains today. Her hot flashes are improved. She is always dealing with some for of allergies with sinus symptoms. She has stress urinary incontinence. She endorses anxiety and depression. A detailed review  of systems is otherwise stable. Marland Kitchen   PAST MEDICAL HISTORY: Past Medical History  Diagnosis Date  . Breast cancer ([place]) 05/08/12    invasive mammary ca, ER/PR+, HER 2 -  . Hypertension   . Arthritis   . GERD (gastroesophageal reflux disease)   . Wears glasses   . PONV (postoperative nausea and vomiting)   . 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 Dilation and curettage of uterus    . Hand osteoplasty  2007    left cmc  . Colonoscopy    . Breast lumpectomy with needle localization and axillary sentinel lymph node bx Left 06/05/2012    Procedure: BREAST LUMPECTOMY WITH NEEDLE LOCALIZATION AND AXILLARY SENTINEL LYMPH NODE BX;  Surgeon: Haywood Lasso, MD;  Location: [facility];  Service: General;  Laterality: Left;  . Polypectomy      uterine  Hand surgery under Ardine Bjork Removal of uterine polyp  FAMILY HISTORY No family history on file. The patient's father died at the age of 57 from heart problems. The patient's mother died at the age of 45, from heart problems. Jill Austin had one brother, no sisters. There is no history of breast or ovarian cancer in the family to her knowledge.  GYNECOLOGIC HISTORY: Menarche age 33, first live birth age 2, she is [place] P2, last menstrual period approximately 1998. She took hormone replacement for approximately 7 years.  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  Her husband Jill Austin (goes by [employer] is retired from [employer]. He is having significant spine problems. Their daughter Jill Austin teaches 60-year-olds in [place] in [place]; daughter Jill Austin lives in [place] and is a homemaker. The patient has 4 grandchildren. She attends the  [employer] of [place]: In place  HEALTH MAINTENANCE: (Updated 03/13/2013) Social History  Substance Use Topics  . Smoking status: Never Smoker   . Smokeless tobacco: Never Used  . Alcohol Use: 6.0 oz/week    10 Glasses of wine per week    Colonoscopy: 2014, [facility]  PAP: 2011  Bone density: 07/25/2012 [facility]: osteopenia, T - 1.8  Lipid panel: Dr. Jonny Ruiz    Allergies  Allergen Reactions  . Penicillins Other (See Comments)    headache  . Vicodin [Hydrocodone-Acetaminophen] Other (See Comments)    Hyper     Current Outpatient Prescriptions  Medication Sig Dispense Refill  . amLODipine (NORVASC) 5 MG tablet 5 mg daily.     . bisoprolol-hydrochlorothiazide (ZIAC) 5-6.25 MG per tablet 1 tablet daily.     . Calcium Carbonate-Vitamin D (CALCIUM 600 + D PO) Take 600 mg by mouth daily.    . fish oil-omega-3 fatty acids 1000 MG capsule Take 2 g by mouth daily.    . meloxicam (MOBIC) 15 MG tablet 15 mg daily.     . Multiple Vitamin (MULTIVITAMIN) capsule Take 1 capsule by mouth daily.    Marland Kitchen omeprazole (PRILOSEC) 20 MG capsule     . pravastatin (PRAVACHOL) 10 MG tablet Take 20 mg by mouth daily.    . sertraline (ZOLOFT) 100 MG tablet 100 mg daily.      No current facility-administered medications for this visit.    OBJECTIVE: Middle-aged white woman who appears stated age 69 Vitals:   03/16/15 1452  BP: 136/87  Pulse: 79  Temp: 98.1 F (36.7 C)  Resp: 18     Body mass index is 30.35 kg/(m^2).    ECOG FS: 1 Filed Weights   03/16/15 1452  Weight: 176 lb 14.4 oz (80.241 kg)   Skin: warm, dry  HEENT: sclerae anicteric, conjunctivae pink, oropharynx clear. No thrush or mucositis.  Lymph Nodes: No cervical or supraclavicular lymphadenopathy  Lungs: clear to auscultation bilaterally, no rales, wheezes, or rhonci  Heart: regular rate and rhythm  Abdomen: round, soft, non tender, positive bowel sounds  Musculoskeletal: No focal spinal  tenderness, no peripheral edema  Neuro: non focal, well oriented, positive affect  Breasts: left breast status post lumpectomy and radiation. No evidence of recurrent disease. Left axilla benign. Right breast unremarkable.  LAB RESULTS: Lab Results  Component Value Date   WBC 6.3 03/16/2015   NEUTROABS 4.3 03/16/2015   HGB 13.3 03/16/2015   HCT 41.4 03/16/2015   MCV 88.3 03/16/2015   PLT 188 03/16/2015      Chemistry      Component Value Date/Time   NA 140 03/16/2015 1429   NA 139 06/04/2012 1200   K 3.7 03/16/2015 1429   K 3.8 06/04/2012 1200   CL 103 06/04/2012 1200   CL 103 05/16/2012 0849   CO2 25 03/16/2015 1429   CO2 26 06/04/2012 1200   BUN 24.3 03/16/2015 1429   BUN 22 06/04/2012 1200   CREATININE 0.8 03/16/2015 1429   CREATININE 0.79 06/04/2012 1200      Component Value Date/Time   CALCIUM 9.0 03/16/2015 1429   CALCIUM 9.2 06/04/2012 1200   ALKPHOS 77 03/16/2015  1429   AST 21 03/16/2015 1429   ALT 16 03/16/2015 1429   BILITOT 1.23* 03/16/2015 1429       STUDIES: No results found.  ASSESSMENT: 69 y.o. Utting woman status post left lumpectomy and sentinel lymph node sampling 06/05/2012 for a mpT1c pN0, stage IA invasive ductal carcinoma, grade 3, estrogen and progesterone receptor positive both at 100%, HER-2/neu negative, with an MIB-1 of 12%.  (1) Oncotype DX showed a score of 14, predicting a distant recurrence risk of 9% within the next 10 years if the patient's only systemic treatment is tamoxifen for 5 years  (2) adjuvant radiation, completed 09/19/2012   (3) Started anastrozole July 2014, discontinued July 2016 with multiple side effects. Tamoxifen to start December 2016  (4) osteopenia with a T score of -1.8 on bone scan April 2014, -1.9 05/29/2014, at [facility]:  Jill Austin and I discussed the merits of antiestrogen therapy. She understands that 5 years of therapy will reduce her likelihood of an estrogen positive breast cancer recurrence,  and has bought into the idea of continuing in some form. Besides the rash and joint aches, she had few major issues with anastrozole.  Given that the has resolved since stopping the drug (likely coincidental) she would like to try tamoxifen instead. We reviewed potential side effects including hot flashes, vaginal changes, blood clots, and the risk of endometrial thickening, polyps, or cancer. She understands the drug is generic and should cost her no more than $10-15 per month. She paid nothing with her insurance on anastrozole.   Annaleigha will return in 3 months for follow up of her tolerance of the tamoxifen. She will have a repeat mammogram prior to this visit. She understands and agrees with this plan. She knows the goal of treatment in her case is cure. She has been encouraged to call with any issues that might arise before her next visit here.  Laurie Panda, NP 03/16/2015.

## 2015-04-17 ENCOUNTER — Other Ambulatory Visit: Payer: Self-pay | Admitting: *Deleted

## 2015-04-17 NOTE — Telephone Encounter (Signed)
Pt called and states she has decided to NOT take tamoxifen, but will restart arimidex. Needs prescription sent to [facility].   Since she is not starting tamoxifen, does she need to come back in 3 months?

## 2015-04-20 ENCOUNTER — Telehealth: Payer: Self-pay | Admitting: Oncology

## 2015-04-20 ENCOUNTER — Other Ambulatory Visit: Payer: Self-pay | Admitting: Oncology

## 2015-04-20 MED ORDER — ANASTROZOLE 1 MG PO TABS: 1.0000 mg | ORAL_TABLET | Freq: Every day | ORAL | Status: DC

## 2015-04-20 NOTE — Telephone Encounter (Signed)
Writer called patient back and informed her that Dr. Jana Hakim sent the arimidex to [facility] and her appt was changed from March to Sept.  Writer left this on a VM with date and time of new appt.

## 2015-04-20 NOTE — Telephone Encounter (Signed)
Val, let her know I send script to [facility] (mail order) and changed March appt to Sept  Thanks!

## 2015-04-20 NOTE — Telephone Encounter (Signed)
Called and left a message with new 12/2015 appointments per pof  anne

## 2015-06-29 ENCOUNTER — Ambulatory Visit: Payer: Medicare Other | Admitting: Oncology

## 2015-10-23 ENCOUNTER — Telehealth: Payer: Self-pay | Admitting: Oncology

## 2015-10-23 NOTE — Telephone Encounter (Signed)
Called patient to confirm appointment. Left voice message. Appointment letter and schedule mailed. Maria F. °

## 2015-12-30 ENCOUNTER — Other Ambulatory Visit: Payer: Self-pay | Admitting: *Deleted

## 2015-12-30 DIAGNOSIS — C50212 Malignant neoplasm of upper-inner quadrant of left female breast: Secondary | ICD-10-CM

## 2015-12-31 ENCOUNTER — Telehealth: Payer: Self-pay | Admitting: *Deleted

## 2015-12-31 ENCOUNTER — Ambulatory Visit (HOSPITAL_BASED_OUTPATIENT_CLINIC_OR_DEPARTMENT_OTHER): Payer: Medicare Other | Admitting: Oncology

## 2015-12-31 ENCOUNTER — Other Ambulatory Visit (HOSPITAL_BASED_OUTPATIENT_CLINIC_OR_DEPARTMENT_OTHER): Payer: Medicare Other

## 2015-12-31 VITALS — BP 154/82 | HR 71 | Temp 98.6°F | Resp 18 | Ht 64.0 in | Wt 159.9 lb

## 2015-12-31 DIAGNOSIS — Z170 Estrogen receptor positive status [ER+]: Secondary | ICD-10-CM | POA: Diagnosis not present

## 2015-12-31 DIAGNOSIS — Z79810 Long term (current) use of selective estrogen receptor modulators (SERMs): Secondary | ICD-10-CM | POA: Diagnosis not present

## 2015-12-31 DIAGNOSIS — C50212 Malignant neoplasm of upper-inner quadrant of left female breast: Secondary | ICD-10-CM | POA: Diagnosis not present

## 2015-12-31 DIAGNOSIS — M8580 Other specified disorders of bone density and structure, unspecified site: Secondary | ICD-10-CM | POA: Diagnosis not present

## 2015-12-31 LAB — CBC WITH DIFFERENTIAL/PLATELET
BASO%: 0.7 % (ref 0.0–2.0)
Basophils Absolute: 0 10*3/uL (ref 0.0–0.1)
EOS ABS: 0.1 10*3/uL (ref 0.0–0.5)
EOS%: 2.4 % (ref 0.0–7.0)
HEMATOCRIT: 42.2 % (ref 34.8–46.6)
HGB: 13.6 g/dL (ref 11.6–15.9)
LYMPH#: 1.2 10*3/uL (ref 0.9–3.3)
LYMPH%: 21.4 % (ref 14.0–49.7)
MCH: 28.6 pg (ref 25.1–34.0)
MCHC: 32.2 g/dL (ref 31.5–36.0)
MCV: 89 fL (ref 79.5–101.0)
MONO#: 0.4 10*3/uL (ref 0.1–0.9)
MONO%: 8.2 % (ref 0.0–14.0)
NEUT#: 3.7 10*3/uL (ref 1.5–6.5)
NEUT%: 67.3 % (ref 38.4–76.8)
PLATELETS: 200 10*3/uL (ref 145–400)
RBC: 4.74 10*6/uL (ref 3.70–5.45)
RDW: 13.7 % (ref 11.2–14.5)
WBC: 5.5 10*3/uL (ref 3.9–10.3)

## 2015-12-31 LAB — COMPREHENSIVE METABOLIC PANEL
ALT: 16 U/L (ref 0–55)
ANION GAP: 11 meq/L (ref 3–11)
AST: 19 U/L (ref 5–34)
Albumin: 3.9 g/dL (ref 3.5–5.0)
Alkaline Phosphatase: 79 U/L (ref 40–150)
BILIRUBIN TOTAL: 1.35 mg/dL — AB (ref 0.20–1.20)
BUN: 15.6 mg/dL (ref 7.0–26.0)
CALCIUM: 9.6 mg/dL (ref 8.4–10.4)
CHLORIDE: 103 meq/L (ref 98–109)
CO2: 27 meq/L (ref 22–29)
CREATININE: 0.8 mg/dL (ref 0.6–1.1)
EGFR: 73 mL/min/{1.73_m2} — AB (ref >90)
Glucose: 96 mg/dl (ref 70–140)
Potassium: 3.8 mEq/L (ref 3.5–5.1)
Sodium: 141 mEq/L (ref 136–145)
TOTAL PROTEIN: 7.2 g/dL (ref 6.4–8.3)

## 2015-12-31 MED ORDER — ANASTROZOLE 1 MG PO TABS: 1.0000 mg | ORAL_TABLET | Freq: Every day | ORAL | 4 refills | Status: DC

## 2015-12-31 NOTE — Progress Notes (Signed)
ID: Jill Austin   DOB: [birth year]  MR#: [id number]  URK#:[id number]  PCP: Jonathon Bellows, MD GYN: Elyse Hsu SU: Osborn Coho OTHER MD: Thea Silversmith, Johnnette Gourd, Luberta Mutter, Lorrene Reid  CHIEF COMPLAINT:  Left Breast Cancer  CURRENT TREATMENT: tamoxifen  BREAST CANCER HISTORY: From the original intake note:  [name] had routine screening mammography at [facility] 05/05/2011, showing heterogeneously dense breasts. There were no findings of concern. On 05/07/2012, a new spiculated mass was noted in the left breast, and the patient was recalled for left breast ultrasound 05/08/2012. This confirmed a hypoechoic mass measuring 1.3 cm, at the 11:00 position 11 cm from the nipple. The left axilla was unremarkable.  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  The patient's subsequent history is as detailed below.  INTERVAL HISTORY: Jill Austin returns today for follow up of her estrogen receptor positive breast cancer. She went off anastrozole July 2016 because of side effects. We started tamoxifen in December but actually she never did started because after reading up on its she felt more comfortable going back to anastrozole which is what she did. She resumed anastrozole January 2016. She is generally tolerating well. She still has hot flashes. She can deal with that she says. She is getting the drug currently at no cost.  REVIEW OF SYSTEMS: Jill Austin had recent eye surgery for macular defect, and her vision is still a little bit blurred. She had a neuropsychological evaluation October of last year and it was within normal limits. She is having knee problems in getting shots for that through Dr. Rise Patience take. She has aches and pains here and there which are not more intense or persistent than before. She  still has a scattered rash which bothers her at times. Otherwise a detailed review of systems today was stable  PAST MEDICAL HISTORY: Past Medical History:  Diagnosis Date  . Arthritis   . Breast cancer ([place]) 05/08/12   invasive mammary ca, ER/PR+, HER 2 -  . GERD (gastroesophageal reflux disease)   . Hypertension   . PONV (postoperative nausea and vomiting)   . S/P radiation therapy 4-12 wks ago 08/06/12-09/24/12   left breast  . Wears glasses    tinnitus and hearing loss left year; history of remote panic attacks; GERD; wrinkle over the left retina  PAST SURGICAL HISTORY: Past Surgical History:  Procedure Laterality Date  . BREAST LUMPECTOMY WITH NEEDLE LOCALIZATION AND AXILLARY SENTINEL LYMPH NODE BX Left 06/05/2012   Procedure: BREAST LUMPECTOMY WITH NEEDLE LOCALIZATION AND AXILLARY SENTINEL LYMPH NODE BX;  Surgeon: Haywood Lasso, MD;  Location: [place];  Service: General;  Laterality: Left;  . COLONOSCOPY    . DILATION AND CURETTAGE OF UTERUS    . HAND OSTEOPLASTY  2007   left [facility]  . POLYPECTOMY     uterine  Hand surgery under Ardine Bjork Removal of uterine polyp  FAMILY HISTORY No family history on file. The patient's father died at the age of 28 from heart problems. The patient's mother died at the age of 5, from heart problems. Jill Austin had one brother, no sisters. There is no history of breast or ovarian cancer in the family to her knowledge.  GYNECOLOGIC HISTORY: Menarche age 32, first live birth age 1, she is [place] P2, last menstrual period approximately 1998. She took hormone  replacement for approximately 7 years.  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 Her husband Jill Austin (goes by [employer] is retired from [employer]. He is having significant spine problems. Their daughter Jill Austin teaches 78-year-olds in [place] in [place]; daughter Jill Austin lives in [place]  and is a homemaker. The patient has 4 grandchildren. She attends [employer] [place]: In place  HEALTH MAINTENANCE: (Updated 03/13/2013) Social History  Substance Use Topics  . Smoking status: Never Smoker  . Smokeless tobacco: Never Used  . Alcohol use 6.0 oz/week    10 Glasses of wine per week    Colonoscopy: 2014, Buccini  PAP: 2011  Bone density: 07/25/2012 SOLIS: osteopenia, T - 1.8  Lipid panel: Dr. Jonny Ruiz    Allergies  Allergen Reactions  . Penicillins Other (See Comments)    headache  . Vicodin [Hydrocodone-Acetaminophen] Other (See Comments)    Hyper     Current Outpatient Prescriptions  Medication Sig Dispense Refill  . amphetamine-dextroamphetamine (ADDERALL XR) 30 MG 24 hr capsule Take 30 mg by mouth daily.    Marland Kitchen amLODipine (NORVASC) 5 MG tablet 5 mg daily.     Marland Kitchen anastrozole (ARIMIDEX) 1 MG tablet Take 1 tablet (1 mg total) by mouth daily. 90 tablet 4  . Calcium Carbonate-Vitamin D (CALCIUM 600 + D PO) Take 600 mg by mouth daily.    . fish oil-omega-3 fatty acids 1000 MG capsule Take 2 g by mouth daily.    . hydrochlorothiazide (MICROZIDE) 12.5 MG capsule Take 12.5 mg by mouth daily.    . meloxicam (MOBIC) 15 MG tablet 15 mg daily.     . Multiple Vitamin (MULTIVITAMIN) capsule Take 1 capsule by mouth daily.    Marland Kitchen omeprazole (PRILOSEC) 20 MG capsule     . pravastatin (PRAVACHOL) 10 MG tablet Take 20 mg by mouth daily.    . sertraline (ZOLOFT) 100 MG tablet 100 mg daily.      No current facility-administered medications for this visit.     OBJECTIVE: Middle-aged white womanIn no acute distress Vitals:   12/31/15 1027  BP: (!) 154/82  Pulse: 71  Resp: 18  Temp: 98.6 F (37 C)     Body mass index is 27.45 kg/m.    ECOG FS: 0 Filed Weights   12/31/15 1027  Weight: 159 lb 14.4 oz (72.5 kg)   Sclerae unicteric, pupils round and equal Oropharynx clear and moist-- no thrush or other lesions No cervical or  supraclavicular adenopathy Lungs no rales or rhonchi Heart regular rate and rhythm Abd soft, nontender, positive bowel sounds MSK no focal spinal tenderness, no upper extremity lymphedema Neuro: nonfocal, well oriented, appropriate affect Breasts: The right breast is unremarkable. The left breast is status post lumpectomy and radiation. There is no evidence of local recurrence. The left axilla is benign.    LAB RESULTS: Lab Results  Component Value Date   WBC 5.5 12/31/2015   NEUTROABS 3.7 12/31/2015   HGB 13.6 12/31/2015   HCT 42.2 12/31/2015   MCV 89.0 12/31/2015   PLT 200 12/31/2015      Chemistry      Component Value Date/Time   NA 141 12/31/2015 0951   K 3.8 12/31/2015 0951   CL 103 06/04/2012 1200   CL 103 05/16/2012 0849   CO2 27 12/31/2015 0951   BUN 15.6 12/31/2015 0951   CREATININE 0.8 12/31/2015 0951      Component Value  Date/Time   CALCIUM 9.6 12/31/2015 0951   ALKPHOS 79 12/31/2015 0951   AST 19 12/31/2015 0951   ALT 16 12/31/2015 0951   BILITOT 1.35 (H) 12/31/2015 0951       STUDIES: Mammography at [facility] in 05/28/22 was unremarkable per report  ASSESSMENT: 70 y.o. [place] woman status post left upper inner quadrant lumpectomy and sentinel lymph node sampling 06/05/2012 for a mpT1c pN0, stage IA invasive ductal carcinoma, grade 3, estrogen and progesterone receptor positive both at 100%, HER-2/neu negative, with an MIB-1 of 12%.  (1) Oncotype DX showed a score of 14, predicting a distant recurrence risk of 9% within the next 10 years if the patient's only systemic treatment is tamoxifen for 5 years  (2) adjuvant radiation, completed 09/19/2012   (3) Started anastrozole July 2014, discontinued July 2016 with multiple side effects. Tamoxifen to start December 2016  (4) osteopenia with a T score of -1.8 on bone scan April 2014, -1.9 05/29/2014, at [place]:  Dasha is back on anastrozole and generally tolerating it well. The plan is to  continue to a total of 5 years.  She has lost quite a bit of weight since December. That's about the time that she started Adderall. There also have been some stresses with some friends families that they have been taking care of and she feels comfortable that this is the reason for the weight loss.  Otherwise she will see me again in a year. She knows to call for any problems that may develop before the next visit  MAGRINAT,GUSTAV C, MD 12/31/15.

## 2015-12-31 NOTE — Telephone Encounter (Signed)
"  I was there to see Dr. Jana Hakim today.  I paid co-pay of fifty dollars.  My specialty co-pay should be thirty dollars.  What do I need to do now.  My husband says the fifty dollars is for providers out of network.  Dr. Jana Hakim is in Network."   Provided the [phone] billing number and patient accounting number.

## 2016-10-16 ENCOUNTER — Telehealth: Payer: Self-pay

## 2016-10-16 NOTE — Telephone Encounter (Signed)
Spoke with patient and she is aware of her new follow up

## 2016-12-29 ENCOUNTER — Ambulatory Visit: Payer: Medicare Other | Admitting: Oncology

## 2016-12-29 ENCOUNTER — Other Ambulatory Visit: Payer: Medicare Other

## 2017-02-01 NOTE — Progress Notes (Signed)
ID: [name]   DOB: 29-Jun-1945  MR#: [id number]  XIP#:[id number]  PCP: Maurice Small, MD GYN: Elyse Hsu SU: Osborn Coho OTHER MD: Thea Silversmith, Johnnette Gourd, Luberta Mutter, Lorrene Reid  CHIEF COMPLAINT:  Left Breast Cancer  CURRENT TREATMENT: Anastrozole  BREAST CANCER HISTORY: From the original intake note:  Jill Austin had routine screening mammography at [facility] 05/05/2011, showing heterogeneously dense breasts. There were no findings of concern. On 05/07/2012, a new spiculated mass was noted in the left breast, and the patient was recalled for left breast ultrasound 05/08/2012. This confirmed a hypoechoic mass measuring 1.3 cm, at the 11:00 position 11 cm from the nipple. The left axilla was unremarkable.  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  The patient's subsequent history is as detailed below.  INTERVAL HISTORY: Jill Austin returns today for follow-up and treatment of her estrogen receptor positive breast cancer.  She continues on anastrozole, with good tolerance. She has some hot flashes, that are mildly alleviated in the winter, and worsened in the summer. She denies vaginal dryness at this time.     REVIEW OF SYSTEMS: Jill Austin reports that she had an endoscopy last week performed by Dr. Herbie Baltimore Buccini due to acid reflux that resulted negative per patient. She is taking protonix which she tolerates well. She reports that she is being evaluated by Dr. Lyla Glassing for her right knee pain and is considering a knee replacement. She has had injections to her right knee with no relief of her symptoms. Due to her right knee pain she is unable to exercise at this time. She denies unusual headaches, visual changes, nausea, vomiting, or dizziness. There has been no unusual cough, phlegm  production, or pleurisy. This been no change in bowel or bladder habits. She denies unexplained fatigue or unexplained weight loss, bleeding, rash, or fever. A detailed review of systems was otherwise stable.    PAST MEDICAL HISTORY: Past Medical History:  Diagnosis Date  . Arthritis   . Breast cancer ([place]) 05/08/12   invasive mammary ca, ER/PR+, HER 2 -  . GERD (gastroesophageal reflux disease)   . Hypertension   . PONV (postoperative nausea and vomiting)   . S/P radiation therapy 4-12 wks ago 08/06/12-09/24/12   left breast  . Wears glasses    tinnitus and hearing loss left year; history of remote panic attacks; GERD; wrinkle over the left retina  PAST SURGICAL HISTORY: Past Surgical History:  Procedure Laterality Date  . COLONOSCOPY    . DILATION AND CURETTAGE OF UTERUS    . HAND OSTEOPLASTY  2007   left [facility]  . POLYPECTOMY     uterine  Hand surgery under Ardine Bjork Removal of uterine polyp  FAMILY HISTORY No family history on file. The patient's father died at the age of 47 from heart problems. The patient's mother died at the age of 72, from heart problems. Jill Austin had one brother, no sisters. There is no history of breast or ovarian cancer in the family to her knowledge.  GYNECOLOGIC HISTORY: Menarche age 42, first live birth age 68, she is [place] P2, last menstrual period approximately 1998. She took hormone replacement for approximately 7 years.  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 Her husband [name] (goes by [employer] is retired from [employer]. He is having significant spine problems. Their daughter  Jill Austin teaches 79-year-olds in [employer] in [place]; daughter Jill Austin lives in [place] and is a homemaker. The patient has 4 grandchildren. She attends the [employer] of [place]: In place  HEALTH MAINTENANCE: (Updated 03/13/2013) Social History    Tobacco Use  . Smoking status: Never Smoker  . Smokeless tobacco: Never Used  Substance Use Topics  . Alcohol use: Yes    Alcohol/week: 6.0 oz    Types: 10 Glasses of wine per week  . Drug use: No    Colonoscopy: 2014, Buccini  PAP: 2011  Bone density: 07/25/2012 SOLIS: osteopenia, T - 1.8  Lipid panel: Dr. Jonny Ruiz    Allergies  Allergen Reactions  . Penicillins Other (See Comments)    headache  . Vicodin [Hydrocodone-Acetaminophen] Other (See Comments)    Hyper     Current Outpatient Medications  Medication Sig Dispense Refill  . amLODipine (NORVASC) 5 MG tablet 5 mg daily.     Marland Kitchen amphetamine-dextroamphetamine (ADDERALL XR) 30 MG 24 hr capsule Take 30 mg by mouth daily.    Marland Kitchen anastrozole (ARIMIDEX) 1 MG tablet Take 1 tablet (1 mg total) by mouth daily. 90 tablet 4  . Calcium Carbonate-Vitamin D (CALCIUM 600 + D PO) Take 600 mg by mouth daily.    . fish oil-omega-3 fatty acids 1000 MG capsule Take 2 g by mouth daily.    . hydrochlorothiazide (MICROZIDE) 12.5 MG capsule Take 12.5 mg by mouth daily.    . meloxicam (MOBIC) 15 MG tablet 15 mg daily.     . Multiple Vitamin (MULTIVITAMIN) capsule Take 1 capsule by mouth daily.    Marland Kitchen omeprazole (PRILOSEC) 20 MG capsule     . pravastatin (PRAVACHOL) 10 MG tablet Take 20 mg by mouth daily.    . sertraline (ZOLOFT) 100 MG tablet 100 mg daily.      No current facility-administered medications for this visit.     OBJECTIVE: Middle-aged white woman who appears well   Vitals:   02/06/17 1434  BP: (!) 162/100  Pulse: 72  Resp: 18  Temp: 98.2 F (36.8 C)  SpO2: 94%     Body mass index is 27.88 kg/m.    ECOG FS: 0 Filed Weights   02/06/17 1434  Weight: 162 lb 6.4 oz (73.7 kg)   Sclerae unicteric, EOMs intact Oropharynx clear and moist No cervical or supraclavicular adenopathy Lungs no rales or rhonchi Heart regular rate and rhythm Abd soft, nontender, positive bowel sounds MSK no focal spinal tenderness, no upper  extremity lymphedema Neuro: nonfocal, well oriented, appropriate affect Breasts: The right breast is benign.  Left breast is status post lumpectomy followed by radiation with no evidence of local recurrence.  Both axillae are benign.  LAB RESULTS: Lab Results  Component Value Date   WBC 5.6 02/06/2017   NEUTROABS 3.4 02/06/2017   HGB 13.6 02/06/2017   HCT 41.8 02/06/2017   MCV 90.7 02/06/2017   PLT 196 02/06/2017      Chemistry      Component Value Date/Time   NA 139 02/06/2017 1345   K 3.7 02/06/2017 1345   CL 103 06/04/2012 1200   CL 103 05/16/2012 0849   CO2 27 02/06/2017 1345   BUN 15.2 02/06/2017 1345   CREATININE 0.8 02/06/2017 1345      Component Value Date/Time   CALCIUM 9.2 02/06/2017 1345   ALKPHOS 68 02/06/2017 1345   AST 20 02/06/2017 1345   ALT 14 02/06/2017  1345   BILITOT 1.14 02/06/2017 1345       STUDIES: Mammography at [facility] in 26-Jun-2022 was unremarkable per report  ASSESSMENT: 71 y.o. [place] woman status post left upper inner quadrant lumpectomy and sentinel lymph node sampling 06/05/2012 for a mpT1c pN0, stage IA invasive ductal carcinoma, grade 3, estrogen and progesterone receptor positive both at 100%, HER-2/neu negative, with an MIB-1 of 12%.  (1) Oncotype DX showed a score of 14, predicting a distant recurrence risk of 9% within the next 10 years if the patient's only systemic treatment is tamoxifen for 5 years  (2) adjuvant radiation, completed 09/19/2012   (3) Started anastrozole July 2014, discontinued July 2016 with multiple side effects, resumed January 2017  (4) osteopenia with a T score of -1.8 on bone scan April 2014, -1.9 05/29/2014, at [facility]:  Tamma is now 4-1/2 years out from definitive surgery for her breast cancer with no evidence of disease recurrence.  This is very favorable.  She continues on anastrozole generally with good tolerance.  The plan is to continue that for a total of 5 years.  Considering that she  missed 6 months  in late 2014, we are going to continue anastrozole through November 2019.  That means next visit will be her "graduation visit".  Today I wrote her for additional bras.  I reassured her that she is not at high risk of ovarian cancer than her neighbor and I explained that we do not have any screening test for ovarian cancer.  (She has had several friends she says died from ovarian cancer recently and this has become a major concern).  She knows to call for any other issues that may develop before her next visit.  Magrinat, Virgie Dad, MD  02/06/17 3:06 PM Medical Oncology and Hematology [facility] [address] [employer], [place] [address] Tel. [phone]    Fax. [phone]  This document serves as a record of services personally performed by Lurline Del, MD. It was created on his behalf by Sheron Nightingale, a trained medical scribe. The creation of this record is based on the scribe's personal observations and the provider's statements to them.   I have reviewed the above documentation for accuracy and completeness, and I agree with the above.

## 2017-02-03 ENCOUNTER — Other Ambulatory Visit: Payer: Self-pay | Admitting: *Deleted

## 2017-02-03 DIAGNOSIS — C50212 Malignant neoplasm of upper-inner quadrant of left female breast: Secondary | ICD-10-CM

## 2017-02-06 ENCOUNTER — Other Ambulatory Visit (HOSPITAL_BASED_OUTPATIENT_CLINIC_OR_DEPARTMENT_OTHER): Payer: Medicare Other

## 2017-02-06 ENCOUNTER — Telehealth: Payer: Self-pay | Admitting: Oncology

## 2017-02-06 ENCOUNTER — Ambulatory Visit (HOSPITAL_BASED_OUTPATIENT_CLINIC_OR_DEPARTMENT_OTHER): Payer: Medicare Other | Admitting: Oncology

## 2017-02-06 VITALS — BP 162/100 | HR 72 | Temp 98.2°F | Resp 18 | Ht 64.0 in | Wt 162.4 lb

## 2017-02-06 DIAGNOSIS — C50212 Malignant neoplasm of upper-inner quadrant of left female breast: Secondary | ICD-10-CM

## 2017-02-06 DIAGNOSIS — Z170 Estrogen receptor positive status [ER+]: Secondary | ICD-10-CM | POA: Diagnosis not present

## 2017-02-06 DIAGNOSIS — Z79811 Long term (current) use of aromatase inhibitors: Secondary | ICD-10-CM | POA: Diagnosis not present

## 2017-02-06 DIAGNOSIS — M8580 Other specified disorders of bone density and structure, unspecified site: Secondary | ICD-10-CM | POA: Diagnosis not present

## 2017-02-06 LAB — CBC WITH DIFFERENTIAL/PLATELET
BASO%: 0.2 % (ref 0.0–2.0)
Basophils Absolute: 0 10*3/uL (ref 0.0–0.1)
EOS%: 2.5 % (ref 0.0–7.0)
Eosinophils Absolute: 0.1 10*3/uL (ref 0.0–0.5)
HCT: 41.8 % (ref 34.8–46.6)
HGB: 13.6 g/dL (ref 11.6–15.9)
LYMPH#: 1.7 10*3/uL (ref 0.9–3.3)
LYMPH%: 29.6 % (ref 14.0–49.7)
MCH: 29.5 pg (ref 25.1–34.0)
MCHC: 32.5 g/dL (ref 31.5–36.0)
MCV: 90.7 fL (ref 79.5–101.0)
MONO#: 0.4 10*3/uL (ref 0.1–0.9)
MONO%: 7.3 % (ref 0.0–14.0)
NEUT%: 60.4 % (ref 38.4–76.8)
NEUTROS ABS: 3.4 10*3/uL (ref 1.5–6.5)
PLATELETS: 196 10*3/uL (ref 145–400)
RBC: 4.61 10*6/uL (ref 3.70–5.45)
RDW: 13.5 % (ref 11.2–14.5)
WBC: 5.6 10*3/uL (ref 3.9–10.3)

## 2017-02-06 LAB — COMPREHENSIVE METABOLIC PANEL
ALBUMIN: 4.1 g/dL (ref 3.5–5.0)
ALK PHOS: 68 U/L (ref 40–150)
ALT: 14 U/L (ref 0–55)
AST: 20 U/L (ref 5–34)
Anion Gap: 8 mEq/L (ref 3–11)
BILIRUBIN TOTAL: 1.14 mg/dL (ref 0.20–1.20)
BUN: 15.2 mg/dL (ref 7.0–26.0)
CALCIUM: 9.2 mg/dL (ref 8.4–10.4)
CO2: 27 mEq/L (ref 22–29)
Chloride: 104 mEq/L (ref 98–109)
Creatinine: 0.8 mg/dL (ref 0.6–1.1)
Glucose: 91 mg/dl (ref 70–140)
POTASSIUM: 3.7 meq/L (ref 3.5–5.1)
Sodium: 139 mEq/L (ref 136–145)
TOTAL PROTEIN: 7 g/dL (ref 6.4–8.3)

## 2017-02-06 NOTE — Telephone Encounter (Signed)
Gave patient avs and calendar with appt per 11/5 los.  °

## 2017-03-24 ENCOUNTER — Other Ambulatory Visit: Payer: Self-pay | Admitting: Oncology

## 2017-05-30 ENCOUNTER — Ambulatory Visit: Payer: Self-pay | Admitting: Orthopedic Surgery

## 2017-06-07 ENCOUNTER — Ambulatory Visit: Payer: Self-pay | Admitting: Orthopedic Surgery

## 2017-06-07 NOTE — H&P (View-Only) (Signed)
TOTAL KNEE ADMISSION H&P  Patient is being admitted for right total knee arthroplasty.  Subjective:  Chief Complaint:right knee pain.  HPI: Jill Austin, 72 y.o. female, has a history of pain and functional disability in the right knee due to arthritis and has failed non-surgical conservative treatments for greater than 12 weeks to includeNSAID's and/or analgesics, corticosteriod injections, viscosupplementation injections, flexibility and strengthening excercises, supervised PT with diminished ADL's post treatment, use of assistive devices and activity modification.  Onset of symptoms was gradual, starting >10 years ago with gradually worsening course since that time. The patient noted no past surgery on the right knee(s).  Patient currently rates pain in the right knee(s) at 10 out of 10 with activity. Patient has night pain, worsening of pain with activity and weight bearing, pain that interferes with activities of daily living, pain with passive range of motion, crepitus and joint swelling.  Patient has evidence of subchondral cysts, subchondral sclerosis, periarticular osteophytes and joint space narrowing by imaging studies. There is no active infection.  Patient Active Problem List   Diagnosis Date Noted  . Rash 03/17/2014  . Arthralgia of both hands 12/12/2013  . Hot flashes related to aromatase inhibitor therapy 03/13/2013  . Mixed hyperlipidemia 05/16/2012  . Essential hypertension, benign 05/16/2012  . Esophageal reflux 05/16/2012  . Primary cancer of upper inner quadrant of left female breast ([place]) 05/11/2012   Past Medical History:  Diagnosis Date  . Arthritis   . Breast cancer ([place]) 05/08/12   invasive mammary ca, ER/PR+, HER 2 -  . GERD (gastroesophageal reflux disease)   . Hypertension   . PONV (postoperative nausea and vomiting)   . S/P radiation therapy 4-12 wks ago 08/06/12-09/24/12   left breast  . Wears glasses     Past Surgical History:  Procedure Laterality Date   . BREAST LUMPECTOMY WITH NEEDLE LOCALIZATION AND AXILLARY SENTINEL LYMPH NODE BX Left 06/05/2012   Procedure: BREAST LUMPECTOMY WITH NEEDLE LOCALIZATION AND AXILLARY SENTINEL LYMPH NODE BX;  Surgeon: Haywood Lasso, MD;  Location: [place] [facility];  Service: General;  Laterality: Left;  . COLONOSCOPY    . DILATION AND CURETTAGE OF UTERUS    . HAND OSTEOPLASTY  2007   left [facility]  . POLYPECTOMY     uterine    Current Outpatient Medications  Medication Sig Dispense Refill Last Dose  . amLODipine (NORVASC) 5 MG tablet 5 mg daily.    Taking  . amphetamine-dextroamphetamine (ADDERALL XR) 30 MG 24 hr capsule Take 30 mg by mouth daily.     Marland Kitchen anastrozole (ARIMIDEX) 1 MG tablet TAKE 1 TABLET BY MOUTH  DAILY 90 tablet 4   . Calcium Carbonate-Vitamin D (CALCIUM 600 + D PO) Take 600 mg by mouth daily.   Taking  . fish oil-omega-3 fatty acids 1000 MG capsule Take 2 g by mouth daily.   Taking  . hydrochlorothiazide (MICROZIDE) 12.5 MG capsule Take 12.5 mg by mouth daily.     . meloxicam (MOBIC) 15 MG tablet 15 mg daily.    Taking  . Multiple Vitamin (MULTIVITAMIN) capsule Take 1 capsule by mouth daily.   Taking  . omeprazole (PRILOSEC) 20 MG capsule    Taking  . pravastatin (PRAVACHOL) 10 MG tablet Take 20 mg by mouth daily.   Taking  . sertraline (ZOLOFT) 100 MG tablet 100 mg daily.    Taking   No current facility-administered medications for this visit.    Allergies  Allergen Reactions  . Penicillins Other (See  Comments)    headache  . Vicodin [Hydrocodone-Acetaminophen] Other (See Comments)    Hyper     Social History   Tobacco Use  . Smoking status: Never Smoker  . Smokeless tobacco: Never Used  Substance Use Topics  . Alcohol use: Yes    Alcohol/week: 6.0 oz    Types: 10 Glasses of wine per week    No family history on file.   Review of Systems  Constitutional: Negative.   HENT: Positive for tinnitus.   Eyes: Negative.   Respiratory: Negative.   Cardiovascular:  Negative.   Gastrointestinal: Negative.   Genitourinary: Negative.   Musculoskeletal: Positive for joint pain.  Skin: Negative.   Neurological: Negative.   Endo/Heme/Allergies: Negative.   Psychiatric/Behavioral: Negative.     Objective:  Physical Exam  Vitals reviewed. Constitutional: She is oriented to person, place, and time. She appears well-developed and well-nourished.  HENT:  Head: Normocephalic and atraumatic.  Eyes: Conjunctivae and EOM are normal. Pupils are equal, round, and reactive to light.  Neck: Normal range of motion. Neck supple.  Cardiovascular: Normal rate, regular rhythm and intact distal pulses.  Respiratory: Effort normal. No respiratory distress.  GI: Soft. She exhibits no distension.  Genitourinary:  Genitourinary Comments: deferred  Musculoskeletal:       Right knee: She exhibits decreased range of motion, swelling, effusion and abnormal alignment. Tenderness found. Medial joint line and lateral joint line tenderness noted.  Neurological: She is alert and oriented to person, place, and time. She has normal reflexes.  Skin: Skin is warm and dry.  Psychiatric: She has a normal mood and affect. Her behavior is normal. Judgment and thought content normal.    Vital signs in last 24 hours: @VSRANGES @  Labs:   Estimated body mass index is 27.88 kg/m as calculated from the following:   Height as of 02/06/17: 5\' 4"  (1.626 m).   Weight as of 02/06/17: 73.7 kg (162 lb 6.4 oz).   Imaging Review Plain radiographs demonstrate severe degenerative joint disease of the right knee(s). The overall alignment issignificant valgus. The bone quality appears to be adequate for age and reported activity level.  Assessment/Plan:  End stage arthritis, right knee   The patient history, physical examination, clinical judgment of the provider and imaging studies are consistent with end stage degenerative joint disease of the right knee(s) and total knee arthroplasty is  deemed medically necessary. The treatment options including medical management, injection therapy arthroscopy and arthroplasty were discussed at length. The risks and benefits of total knee arthroplasty were presented and reviewed. The risks due to aseptic loosening, infection, stiffness, patella tracking problems, thromboembolic complications and other imponderables were discussed. The patient acknowledged the explanation, agreed to proceed with the plan and consent was signed. Patient is being admitted for inpatient treatment for surgery, pain control, PT, OT, prophylactic antibiotics, VTE prophylaxis, progressive ambulation and ADL's and discharge planning. The patient is planning to be discharged outpaitent PT at [facility]

## 2017-06-07 NOTE — H&P (Signed)
TOTAL KNEE ADMISSION H&P  Patient is being admitted for right total knee arthroplasty.  Subjective:  Chief Complaint:right knee pain.  HPI: Jill Austin, 72 y.o. female, has a history of pain and functional disability in the right knee due to arthritis and has failed non-surgical conservative treatments for greater than 12 weeks to includeNSAID's and/or analgesics, corticosteriod injections, viscosupplementation injections, flexibility and strengthening excercises, supervised PT with diminished ADL's post treatment, use of assistive devices and activity modification.  Onset of symptoms was gradual, starting >10 years ago with gradually worsening course since that time. The patient noted no past surgery on the right knee(s).  Patient currently rates pain in the right knee(s) at 10 out of 10 with activity. Patient has night pain, worsening of pain with activity and weight bearing, pain that interferes with activities of daily living, pain with passive range of motion, crepitus and joint swelling.  Patient has evidence of subchondral cysts, subchondral sclerosis, periarticular osteophytes and joint space narrowing by imaging studies. There is no active infection.  Patient Active Problem List   Diagnosis Date Noted  . Rash 03/17/2014  . Arthralgia of both hands 12/12/2013  . Hot flashes related to aromatase inhibitor therapy 03/13/2013  . Mixed hyperlipidemia 05/16/2012  . Essential hypertension, benign 05/16/2012  . Esophageal reflux 05/16/2012  . Primary cancer of upper inner quadrant of left female breast ([place]) 05/11/2012   Past Medical History:  Diagnosis Date  . Arthritis   . Breast cancer ([place]) 05/08/12   invasive mammary ca, ER/PR+, HER 2 -  . GERD (gastroesophageal reflux disease)   . Hypertension   . PONV (postoperative nausea and vomiting)   . S/P radiation therapy 4-12 wks ago 08/06/12-09/24/12   left breast  . Wears glasses     Past Surgical History:  Procedure Laterality Date   . BREAST LUMPECTOMY WITH NEEDLE LOCALIZATION AND AXILLARY SENTINEL LYMPH NODE BX Left 06/05/2012   Procedure: BREAST LUMPECTOMY WITH NEEDLE LOCALIZATION AND AXILLARY SENTINEL LYMPH NODE BX;  Surgeon: Haywood Lasso, MD;  Location: [place];  Service: General;  Laterality: Left;  . COLONOSCOPY    . DILATION AND CURETTAGE OF UTERUS    . HAND OSTEOPLASTY  2007   left [facility]  . POLYPECTOMY     uterine    Current Outpatient Medications  Medication Sig Dispense Refill Last Dose  . amLODipine (NORVASC) 5 MG tablet 5 mg daily.    Taking  . amphetamine-dextroamphetamine (ADDERALL XR) 30 MG 24 hr capsule Take 30 mg by mouth daily.     Marland Kitchen anastrozole (ARIMIDEX) 1 MG tablet TAKE 1 TABLET BY MOUTH  DAILY 90 tablet 4   . Calcium Carbonate-Vitamin D (CALCIUM 600 + D PO) Take 600 mg by mouth daily.   Taking  . fish oil-omega-3 fatty acids 1000 MG capsule Take 2 g by mouth daily.   Taking  . hydrochlorothiazide (MICROZIDE) 12.5 MG capsule Take 12.5 mg by mouth daily.     . meloxicam (MOBIC) 15 MG tablet 15 mg daily.    Taking  . Multiple Vitamin (MULTIVITAMIN) capsule Take 1 capsule by mouth daily.   Taking  . omeprazole (PRILOSEC) 20 MG capsule    Taking  . pravastatin (PRAVACHOL) 10 MG tablet Take 20 mg by mouth daily.   Taking  . sertraline (ZOLOFT) 100 MG tablet 100 mg daily.    Taking   No current facility-administered medications for this visit.    Allergies  Allergen Reactions  . Penicillins Other (See  Comments)    headache  . Vicodin [Hydrocodone-Acetaminophen] Other (See Comments)    Hyper     Social History   Tobacco Use  . Smoking status: Never Smoker  . Smokeless tobacco: Never Used  Substance Use Topics  . Alcohol use: Yes    Alcohol/week: 6.0 oz    Types: 10 Glasses of wine per week    No family history on file.   Review of Systems  Constitutional: Negative.   HENT: Positive for tinnitus.   Eyes: Negative.   Respiratory: Negative.   Cardiovascular:  Negative.   Gastrointestinal: Negative.   Genitourinary: Negative.   Musculoskeletal: Positive for joint pain.  Skin: Negative.   Neurological: Negative.   Endo/Heme/Allergies: Negative.   Psychiatric/Behavioral: Negative.     Objective:  Physical Exam  Vitals reviewed. Constitutional: She is oriented to person, place, and time. She appears well-developed and well-nourished.  HENT:  Head: Normocephalic and atraumatic.  Eyes: Conjunctivae and EOM are normal. Pupils are equal, round, and reactive to light.  Neck: Normal range of motion. Neck supple.  Cardiovascular: Normal rate, regular rhythm and intact distal pulses.  Respiratory: Effort normal. No respiratory distress.  GI: Soft. She exhibits no distension.  Genitourinary:  Genitourinary Comments: deferred  Musculoskeletal:       Right knee: She exhibits decreased range of motion, swelling, effusion and abnormal alignment. Tenderness found. Medial joint line and lateral joint line tenderness noted.  Neurological: She is alert and oriented to person, place, and time. She has normal reflexes.  Skin: Skin is warm and dry.  Psychiatric: She has a normal mood and affect. Her behavior is normal. Judgment and thought content normal.    Vital signs in last 24 hours: @VSRANGES @  Labs:   Estimated body mass index is 27.88 kg/m as calculated from the following:   Height as of 02/06/17: 5\' 4"  (1.626 m).   Weight as of 02/06/17: 73.7 kg (162 lb 6.4 oz).   Imaging Review Plain radiographs demonstrate severe degenerative joint disease of the right knee(s). The overall alignment issignificant valgus. The bone quality appears to be adequate for age and reported activity level.  Assessment/Plan:  End stage arthritis, right knee   The patient history, physical examination, clinical judgment of the provider and imaging studies are consistent with end stage degenerative joint disease of the right knee(s) and total knee arthroplasty is  deemed medically necessary. The treatment options including medical management, injection therapy arthroscopy and arthroplasty were discussed at length. The risks and benefits of total knee arthroplasty were presented and reviewed. The risks due to aseptic loosening, infection, stiffness, patella tracking problems, thromboembolic complications and other imponderables were discussed. The patient acknowledged the explanation, agreed to proceed with the plan and consent was signed. Patient is being admitted for inpatient treatment for surgery, pain control, PT, OT, prophylactic antibiotics, VTE prophylaxis, progressive ambulation and ADL's and discharge planning. The patient is planning to be discharged outpaitent PT at [facility]

## 2017-06-13 NOTE — Progress Notes (Signed)
05/15/2017- Medical Clearance on chart from Dr. Maurice Small with labs-CBC w/diff., U/A, CMP, HgA1C and office visit with EKG

## 2017-06-13 NOTE — Patient Instructions (Addendum)
Jill Austin  06/13/2017   Your procedure is scheduled on: Thursday 06/22/2017  Report to [facility] Main  Entrance   ARRIVE AT 530 AM. Have a seat in the Main Lobby. Please note there is a phone at the [employer]. Please call [phone] on that phone. Someone from Short Stay will come and get you from the Main Lobby and take you to Short Stay.   Call this number if you have problems the morning of surgery [phone]     Remember: Do not eat food or drink liquids :After Midnight.     Take these medicines the morning of surgery with A SIP OF WATER: Amlodipine (Norvasc), Anastrozole (Arimidex),  loratadine (Claritin), Sertraline (Zoloft), Prilosec                                You may not have any metal on your body including hair pins and              piercings  Do not wear jewelry, make-up, lotions, powders or perfumes, deodorant             Do not wear nail polish.  Do not shave  48 hours prior to surgery.              Men may shave face and neck.   Do not bring valuables to the hospital. Jill Austin.  Contacts, dentures or bridgework may not be worn into surgery.  Leave suitcase in the car. After surgery it may be brought to your room.                Please read over the following fact sheets you were given: _____________________________________________________________________             [facility] - Preparing for Surgery Before surgery, you can play an important role.  Because skin is not sterile, your skin needs to be as free of germs as possible.  You can reduce the number of germs on your skin by washing with CHG (chlorahexidine gluconate) soap before surgery.  CHG is an antiseptic cleaner which kills germs and bonds with the skin to continue killing germs even after washing. Please DO NOT use if you have an allergy to CHG or antibacterial soaps.  If your skin becomes  reddened/irritated stop using the CHG and inform your nurse when you arrive at Short Stay. Do not shave (including legs and underarms) for at least 48 hours prior to the first CHG shower.  You may shave your face/neck. Please follow these instructions carefully:  1.  Shower with CHG Soap the night before surgery and the  morning of Surgery.  2.  If you choose to wash your hair, wash your hair first as usual with your  normal  shampoo.  3.  After you shampoo, rinse your hair and body thoroughly to remove the  shampoo.                           4.  Use CHG as you would any other liquid soap.  You can apply chg directly  to the skin and wash  Gently with a scrungie or clean washcloth.  5.  Apply the CHG Soap to your body ONLY FROM THE NECK DOWN.   Do not use on face/ open                           Wound or open sores. Avoid contact with eyes, ears mouth and genitals (private parts).                       Wash face,  Genitals (private parts) with your normal soap.             6.  Wash thoroughly, paying special attention to the area where your surgery  will be performed.  7.  Thoroughly rinse your body with warm water from the neck down.  8.  DO NOT shower/wash with your normal soap after using and rinsing off  the CHG Soap.                9.  Pat yourself dry with a clean towel.            10.  Wear clean pajamas.            11.  Place clean sheets on your bed the night of your first shower and do not  sleep with pets. Day of Surgery : Do not apply any lotions/deodorants the morning of surgery.  Please wear clean clothes to the hospital/surgery center.  FAILURE TO FOLLOW THESE INSTRUCTIONS MAY RESULT IN THE CANCELLATION OF YOUR SURGERY PATIENT SIGNATURE_________________________________  NURSE SIGNATURE__________________________________  ________________________________________________________________________   Jill Austin  An incentive spirometer is a tool that  can help keep your lungs clear and active. This tool measures how well you are filling your lungs with each breath. Taking long deep breaths may help reverse or decrease the chance of developing breathing (pulmonary) problems (especially infection) following:  A long period of time when you are unable to move or be active. BEFORE THE PROCEDURE   If the spirometer includes an indicator to show your best effort, your nurse or respiratory therapist will set it to a desired goal.  If possible, sit up straight or lean slightly forward. Try not to slouch.  Hold the incentive spirometer in an upright position. INSTRUCTIONS FOR USE  1. Sit on the edge of your bed if possible, or sit up as far as you can in bed or on a chair. 2. Hold the incentive spirometer in an upright position. 3. Breathe out normally. 4. Place the mouthpiece in your mouth and seal your lips tightly around it. 5. Breathe in slowly and as deeply as possible, raising the piston or the ball toward the top of the column. 6. Hold your breath for 3-5 seconds or for as long as possible. Allow the piston or ball to fall to the bottom of the column. 7. Remove the mouthpiece from your mouth and breathe out normally. 8. Rest for a few seconds and repeat Steps 1 through 7 at least 10 times every 1-2 hours when you are awake. Take your time and take a few normal breaths between deep breaths. 9. The spirometer may include an indicator to show your best effort. Use the indicator as a goal to work toward during each repetition. 10. After each set of 10 deep breaths, practice coughing to be sure your lungs are clear. If you have an incision (the cut made at the time of surgery),  support your incision when coughing by placing a pillow or rolled up towels firmly against it. Once you are able to get out of bed, walk around indoors and cough well. You may stop using the incentive spirometer when instructed by your caregiver.  RISKS AND  COMPLICATIONS  Take your time so you do not get dizzy or light-headed.  If you are in pain, you may need to take or ask for pain medication before doing incentive spirometry. It is harder to take a deep breath if you are having pain. AFTER USE  Rest and breathe slowly and easily.  It can be helpful to keep track of a log of your progress. Your caregiver can provide you with a simple table to help with this. If you are using the spirometer at home, follow these instructions: [place] IF:   You are having difficultly using the spirometer.  You have trouble using the spirometer as often as instructed.  Your pain medication is not giving enough relief while using the spirometer.  You develop fever of 100.5 F (38.1 C) or higher. SEEK IMMEDIATE MEDICAL CARE IF:   You cough up bloody sputum that had not been present before.  You develop fever of 102 F (38.9 C) or greater.  You develop worsening pain at or near the incision site. MAKE SURE YOU:   Understand these instructions.  Will watch your condition.  Will get help right away if you are not doing well or get worse. Document Released: 08/01/2006 Document Revised: 06/13/2011 Document Reviewed: 10/02/2006 Exit[employer] Patient Information 2014 Exit[employer], [place].   ________________________________________________________________________  WHAT IS A BLOOD TRANSFUSION? Blood Transfusion Information  A transfusion is the replacement of blood or some of its parts. Blood is made up of multiple cells which provide different functions.  Red blood cells carry oxygen and are used for blood loss replacement.  White blood cells fight against infection.  Platelets control bleeding.  Plasma helps clot blood.  Other blood products are available for specialized needs, such as hemophilia or other clotting disorders. BEFORE THE TRANSFUSION  Who gives blood for transfusions?   Healthy volunteers who are fully evaluated to make sure  their blood is safe. This is blood bank blood. Transfusion therapy is the safest it has ever been in the practice of medicine. Before blood is taken from a donor, a complete history is taken to make sure that person has no history of diseases nor engages in risky social behavior (examples are intravenous drug use or sexual activity with multiple partners). The donor's travel history is screened to minimize risk of transmitting infections, such as malaria. The donated blood is tested for signs of infectious diseases, such as HIV and hepatitis. The blood is then tested to be sure it is compatible with you in order to minimize the chance of a transfusion reaction. If you or a relative donates blood, this is often done in anticipation of surgery and is not appropriate for emergency situations. It takes many days to process the donated blood. RISKS AND COMPLICATIONS Although transfusion therapy is very safe and saves many lives, the main dangers of transfusion include:   Getting an infectious disease.  Developing a transfusion reaction. This is an allergic reaction to something in the blood you were given. Every precaution is taken to prevent this. The decision to have a blood transfusion has been considered carefully by your caregiver before blood is given. Blood is not given unless the benefits outweigh the risks. AFTER THE TRANSFUSION  Right after receiving a blood transfusion, you will usually feel much better and more energetic. This is especially true if your red blood cells have gotten low (anemic). The transfusion raises the level of the red blood cells which carry oxygen, and this usually causes an energy increase.  The nurse administering the transfusion will monitor you carefully for complications. HOME CARE INSTRUCTIONS  No special instructions are needed after a transfusion. You may find your energy is better. Speak with your caregiver about any limitations on activity for underlying diseases  you may have. SEEK MEDICAL CARE IF:   Your condition is not improving after your transfusion.  You develop redness or irritation at the intravenous (IV) site. SEEK IMMEDIATE MEDICAL CARE IF:  Any of the following symptoms occur over the next 12 hours:  Shaking chills.  You have a temperature by mouth above 102 F (38.9 C), not controlled by medicine.  Chest, back, or muscle pain.  People around you feel you are not acting correctly or are confused.  Shortness of breath or difficulty breathing.  Dizziness and fainting.  You get a rash or develop hives.  You have a decrease in urine output.  Your urine turns a dark color or changes to pink, red, or brown. Any of the following symptoms occur over the next 10 days:  You have a temperature by mouth above 102 F (38.9 C), not controlled by medicine.  Shortness of breath.  Weakness after normal activity.  The white part of the eye turns yellow (jaundice).  You have a decrease in the amount of urine or are urinating less often.  Your urine turns a dark color or changes to pink, red, or brown. Document Released: 03/18/2000 Document Revised: 06/13/2011 Document Reviewed: 11/05/2007 [facility] Patient Information 2014 [place], [place].  _______________________________________________________________________

## 2017-06-15 ENCOUNTER — Encounter (HOSPITAL_COMMUNITY)
Admission: RE | Admit: 2017-06-15 | Discharge: 2017-06-15 | Disposition: A | Discharge status: Home / Self Care | Payer: Medicare Other | Source: Ambulatory Visit | Attending: Orthopedic Surgery | Admitting: Orthopedic Surgery

## 2017-06-15 ENCOUNTER — Encounter (HOSPITAL_COMMUNITY): Payer: Self-pay

## 2017-06-15 ENCOUNTER — Other Ambulatory Visit: Payer: Self-pay

## 2017-06-15 DIAGNOSIS — M1711 Unilateral primary osteoarthritis, right knee: Secondary | ICD-10-CM | POA: Insufficient documentation

## 2017-06-15 DIAGNOSIS — Z01818 Encounter for other preprocedural examination: Secondary | ICD-10-CM | POA: Diagnosis not present

## 2017-06-15 LAB — ABO/RH: ABO/RH(D): O POS

## 2017-06-15 LAB — SURGICAL PCR SCREEN
MRSA, PCR: NEGATIVE
STAPHYLOCOCCUS AUREUS: NEGATIVE

## 2017-06-21 MED ORDER — TRANEXAMIC ACID 1000 MG/10ML IV SOLN
1000.0000 mg | INTRAVENOUS | Status: AC
  Administered 2017-06-22: 1000 mg via INTRAVENOUS
  Filled 2017-06-21: qty 1100

## 2017-06-22 ENCOUNTER — Inpatient Hospital Stay (HOSPITAL_COMMUNITY): Payer: Medicare Other

## 2017-06-22 ENCOUNTER — Inpatient Hospital Stay (HOSPITAL_COMMUNITY)
Admission: RE | Admit: 2017-06-22 | Discharge: 2017-06-25 | DRG: 470 | Disposition: A | Discharge status: Home Health | Payer: Medicare Other | Source: Ambulatory Visit | Attending: Orthopedic Surgery | Admitting: Orthopedic Surgery

## 2017-06-22 ENCOUNTER — Ambulatory Visit (HOSPITAL_COMMUNITY): Payer: Medicare Other | Admitting: Anesthesiology

## 2017-06-22 ENCOUNTER — Encounter (HOSPITAL_COMMUNITY): Payer: Self-pay | Admitting: Anesthesiology

## 2017-06-22 ENCOUNTER — Encounter (HOSPITAL_COMMUNITY)
Admission: RE | Disposition: A | Discharge status: Home Health | Payer: Self-pay | Source: Ambulatory Visit | Attending: Orthopedic Surgery

## 2017-06-22 ENCOUNTER — Other Ambulatory Visit: Payer: Self-pay

## 2017-06-22 DIAGNOSIS — M25561 Pain in right knee: Secondary | ICD-10-CM | POA: Diagnosis present

## 2017-06-22 DIAGNOSIS — E782 Mixed hyperlipidemia: Secondary | ICD-10-CM | POA: Diagnosis present

## 2017-06-22 DIAGNOSIS — K219 Gastro-esophageal reflux disease without esophagitis: Secondary | ICD-10-CM | POA: Diagnosis present

## 2017-06-22 DIAGNOSIS — Z79899 Other long term (current) drug therapy: Secondary | ICD-10-CM | POA: Diagnosis not present

## 2017-06-22 DIAGNOSIS — M1711 Unilateral primary osteoarthritis, right knee: Principal | ICD-10-CM | POA: Diagnosis present

## 2017-06-22 DIAGNOSIS — Z79811 Long term (current) use of aromatase inhibitors: Secondary | ICD-10-CM

## 2017-06-22 DIAGNOSIS — I959 Hypotension, unspecified: Secondary | ICD-10-CM | POA: Diagnosis not present

## 2017-06-22 DIAGNOSIS — Z853 Personal history of malignant neoplasm of breast: Secondary | ICD-10-CM

## 2017-06-22 DIAGNOSIS — Z96651 Presence of right artificial knee joint: Secondary | ICD-10-CM

## 2017-06-22 DIAGNOSIS — Z923 Personal history of irradiation: Secondary | ICD-10-CM

## 2017-06-22 DIAGNOSIS — I10 Essential (primary) hypertension: Secondary | ICD-10-CM | POA: Diagnosis present

## 2017-06-22 HISTORY — PX: KNEE ARTHROPLASTY: SHX992

## 2017-06-22 LAB — POCT I-STAT 4, (NA,K, GLUC, HGB,HCT)
Glucose, Bld: 102 mg/dL — ABNORMAL HIGH (ref 65–99)
HEMATOCRIT: 36 % (ref 36.0–46.0)
HEMOGLOBIN: 12.2 g/dL (ref 12.0–15.0)
POTASSIUM: 3.7 mmol/L (ref 3.5–5.1)
SODIUM: 142 mmol/L (ref 135–145)

## 2017-06-22 LAB — TYPE AND SCREEN
ABO/RH(D): O POS
Antibody Screen: NEGATIVE

## 2017-06-22 SURGERY — ARTHROPLASTY, KNEE, TOTAL, USING IMAGELESS COMPUTER-ASSISTED NAVIGATION
Anesthesia: General | Site: Knee | Laterality: Right

## 2017-06-22 MED ORDER — CALCIUM CARBONATE ANTACID 500 MG PO CHEW
1.0000 | CHEWABLE_TABLET | Freq: Every day | ORAL | Status: DC | PRN
  Administered 2017-06-23: 20:00:00 400 mg via ORAL
  Filled 2017-06-22: qty 2

## 2017-06-22 MED ORDER — CLINDAMYCIN PHOSPHATE 600 MG/50ML IV SOLN
600.0000 mg | Freq: Four times a day (QID) | INTRAVENOUS | Status: AC
  Administered 2017-06-22 (×2): 600 mg via INTRAVENOUS
  Filled 2017-06-22 (×2): qty 50

## 2017-06-22 MED ORDER — METOCLOPRAMIDE HCL 5 MG/ML IJ SOLN: 5.0000 mg | Freq: Three times a day (TID) | INTRAMUSCULAR | Status: DC | PRN

## 2017-06-22 MED ORDER — MIDAZOLAM HCL 2 MG/2ML IJ SOLN: 1.0000 mg | INTRAMUSCULAR | Status: DC

## 2017-06-22 MED ORDER — CLINDAMYCIN PHOSPHATE 600 MG/50ML IV SOLN: 600.0000 mg | Freq: Four times a day (QID) | INTRAVENOUS | Status: DC

## 2017-06-22 MED ORDER — PHENYLEPHRINE 40 MCG/ML (10ML) SYRINGE FOR IV PUSH (FOR BLOOD PRESSURE SUPPORT)
PREFILLED_SYRINGE | INTRAVENOUS | Status: AC
  Filled 2017-06-22: qty 10

## 2017-06-22 MED ORDER — DEXAMETHASONE SODIUM PHOSPHATE 10 MG/ML IJ SOLN
INTRAMUSCULAR | Status: DC | PRN
  Administered 2017-06-22: 10 mg via INTRAVENOUS

## 2017-06-22 MED ORDER — DEXTROSE 5 % IV SOLN
INTRAVENOUS | Status: DC | PRN
  Administered 2017-06-22: 40 ug/min via INTRAVENOUS

## 2017-06-22 MED ORDER — CLINDAMYCIN PHOSPHATE 900 MG/50ML IV SOLN
900.0000 mg | INTRAVENOUS | Status: AC
  Administered 2017-06-22: 900 mg via INTRAVENOUS
  Filled 2017-06-22: qty 50

## 2017-06-22 MED ORDER — SODIUM CHLORIDE 0.9 % IV SOLN
INTRAVENOUS | Status: DC
  Administered 2017-06-22 (×2): via INTRAVENOUS

## 2017-06-22 MED ORDER — BUPIVACAINE HCL (PF) 0.25 % IJ SOLN
INTRAMUSCULAR | Status: DC | PRN
  Administered 2017-06-22: 30 mL

## 2017-06-22 MED ORDER — ONDANSETRON HCL 4 MG PO TABS
4.0000 mg | ORAL_TABLET | Freq: Four times a day (QID) | ORAL | Status: DC | PRN
  Administered 2017-06-23: 4 mg via ORAL
  Filled 2017-06-22: qty 1

## 2017-06-22 MED ORDER — OMEGA-3-ACID ETHYL ESTERS 1 G PO CAPS
1.0000 g | ORAL_CAPSULE | Freq: Every day | ORAL | Status: DC
  Administered 2017-06-23 – 2017-06-25 (×3): 1 g via ORAL
  Filled 2017-06-22 (×3): qty 1

## 2017-06-22 MED ORDER — SERTRALINE HCL 100 MG PO TABS
100.0000 mg | ORAL_TABLET | Freq: Every day | ORAL | Status: DC
  Administered 2017-06-23 – 2017-06-25 (×3): 100 mg via ORAL
  Filled 2017-06-22 (×3): qty 1

## 2017-06-22 MED ORDER — DEXAMETHASONE SODIUM PHOSPHATE 10 MG/ML IJ SOLN
INTRAMUSCULAR | Status: AC
  Filled 2017-06-22: qty 1

## 2017-06-22 MED ORDER — FENTANYL CITRATE (PF) 100 MCG/2ML IJ SOLN
50.0000 ug | INTRAMUSCULAR | Status: DC
Start: 2017-06-22 — End: 2017-06-22

## 2017-06-22 MED ORDER — SODIUM CHLORIDE 0.9 % IJ SOLN
INTRAMUSCULAR | Status: DC | PRN
  Administered 2017-06-22: 30 mL

## 2017-06-22 MED ORDER — BUPIVACAINE HCL (PF) 0.25 % IJ SOLN
INTRAMUSCULAR | Status: AC
  Filled 2017-06-22: qty 30

## 2017-06-22 MED ORDER — DIPHENHYDRAMINE HCL 12.5 MG/5ML PO ELIX: 12.5000 mg | ORAL_SOLUTION | ORAL | Status: DC | PRN

## 2017-06-22 MED ORDER — LACTATED RINGERS IV SOLN
INTRAVENOUS | Status: DC
  Administered 2017-06-22: 07:00:00 via INTRAVENOUS

## 2017-06-22 MED ORDER — SIMETHICONE 80 MG PO CHEW: 160.0000 mg | CHEWABLE_TABLET | Freq: Every day | ORAL | Status: DC | PRN

## 2017-06-22 MED ORDER — KETOROLAC TROMETHAMINE 30 MG/ML IJ SOLN
INTRAMUSCULAR | Status: DC | PRN
  Administered 2017-06-22: 30 mg

## 2017-06-22 MED ORDER — SODIUM CHLORIDE 0.9 % IR SOLN
Status: DC | PRN
  Administered 2017-06-22: 1000 mL

## 2017-06-22 MED ORDER — PROPOFOL 10 MG/ML IV BOLUS
INTRAVENOUS | Status: AC
  Filled 2017-06-22: qty 60

## 2017-06-22 MED ORDER — KETOROLAC TROMETHAMINE 30 MG/ML IJ SOLN
INTRAMUSCULAR | Status: AC
  Filled 2017-06-22: qty 1

## 2017-06-22 MED ORDER — SENNA 8.6 MG PO TABS
1.0000 | ORAL_TABLET | Freq: Two times a day (BID) | ORAL | Status: DC
Start: 2017-06-22 — End: 2017-06-25
  Administered 2017-06-22 – 2017-06-25 (×6): 8.6 mg via ORAL
  Filled 2017-06-22 (×6): qty 1

## 2017-06-22 MED ORDER — FENTANYL CITRATE (PF) 100 MCG/2ML IJ SOLN
INTRAMUSCULAR | Status: AC
Start: 2017-06-22 — End: 2017-06-22
  Filled 2017-06-22: qty 2

## 2017-06-22 MED ORDER — ASPIRIN 81 MG PO CHEW
81.0000 mg | CHEWABLE_TABLET | Freq: Two times a day (BID) | ORAL | Status: DC
  Administered 2017-06-22 – 2017-06-25 (×6): 81 mg via ORAL
  Filled 2017-06-22 (×6): qty 1

## 2017-06-22 MED ORDER — FENTANYL CITRATE (PF) 100 MCG/2ML IJ SOLN: 25.0000 ug | INTRAMUSCULAR | Status: DC | PRN

## 2017-06-22 MED ORDER — ISOPROPYL ALCOHOL 70 % SOLN
Status: DC | PRN
  Administered 2017-06-22: 1 via TOPICAL

## 2017-06-22 MED ORDER — PHENYLEPHRINE HCL 10 MG/ML IJ SOLN
INTRAMUSCULAR | Status: AC
  Filled 2017-06-22: qty 1

## 2017-06-22 MED ORDER — AMLODIPINE BESYLATE 5 MG PO TABS
5.0000 mg | ORAL_TABLET | Freq: Every day | ORAL | Status: DC
  Administered 2017-06-23 – 2017-06-25 (×3): 5 mg via ORAL
  Filled 2017-06-22 (×3): qty 1

## 2017-06-22 MED ORDER — MIDAZOLAM HCL 2 MG/2ML IJ SOLN
INTRAMUSCULAR | Status: DC | PRN
  Administered 2017-06-22 (×2): 1 mg via INTRAVENOUS

## 2017-06-22 MED ORDER — HYDROMORPHONE HCL 1 MG/ML IJ SOLN: 0.5000 mg | INTRAMUSCULAR | Status: DC | PRN

## 2017-06-22 MED ORDER — ACETAMINOPHEN 325 MG PO TABS: 325.0000 mg | ORAL_TABLET | Freq: Four times a day (QID) | ORAL | Status: DC | PRN

## 2017-06-22 MED ORDER — METHOCARBAMOL 500 MG PO TABS
500.0000 mg | ORAL_TABLET | Freq: Four times a day (QID) | ORAL | Status: DC | PRN
  Administered 2017-06-23 – 2017-06-24 (×4): 500 mg via ORAL
  Filled 2017-06-22 (×5): qty 1

## 2017-06-22 MED ORDER — DOCUSATE SODIUM 100 MG PO CAPS
100.0000 mg | ORAL_CAPSULE | Freq: Two times a day (BID) | ORAL | Status: DC
  Administered 2017-06-22 – 2017-06-25 (×6): 100 mg via ORAL
  Filled 2017-06-22 (×6): qty 1

## 2017-06-22 MED ORDER — POLYVINYL ALCOHOL 1.4 % OP SOLN: 1.0000 [drp] | OPHTHALMIC | Status: DC | PRN

## 2017-06-22 MED ORDER — ACETAMINOPHEN 10 MG/ML IV SOLN
1000.0000 mg | INTRAVENOUS | Status: AC
  Administered 2017-06-22: 1000 mg via INTRAVENOUS
  Filled 2017-06-22: qty 100

## 2017-06-22 MED ORDER — HYDROMORPHONE HCL 2 MG PO TABS
1.0000 mg | ORAL_TABLET | ORAL | Status: DC | PRN
Start: 2017-06-22 — End: 2017-06-25
  Administered 2017-06-22 – 2017-06-25 (×15): 2 mg via ORAL
  Filled 2017-06-22 (×15): qty 1

## 2017-06-22 MED ORDER — PROPOFOL 500 MG/50ML IV EMUL
INTRAVENOUS | Status: DC | PRN
  Administered 2017-06-22: 50 ug/kg/min via INTRAVENOUS

## 2017-06-22 MED ORDER — MENTHOL 3 MG MT LOZG: 1.0000 | LOZENGE | OROMUCOSAL | Status: DC | PRN

## 2017-06-22 MED ORDER — ALUM & MAG HYDROXIDE-SIMETH 200-200-20 MG/5ML PO SUSP: 30.0000 mL | ORAL | Status: DC | PRN

## 2017-06-22 MED ORDER — POVIDONE-IODINE 10 % EX SWAB
2.0000 "application " | Freq: Once | CUTANEOUS | Status: AC
  Administered 2017-06-22: 2 via TOPICAL

## 2017-06-22 MED ORDER — PANTOPRAZOLE SODIUM 40 MG PO TBEC: 40.0000 mg | DELAYED_RELEASE_TABLET | Freq: Every day | ORAL | Status: DC

## 2017-06-22 MED ORDER — BUPIVACAINE IN DEXTROSE 0.75-8.25 % IT SOLN
INTRATHECAL | Status: DC | PRN
  Administered 2017-06-22: 2 mL via INTRATHECAL

## 2017-06-22 MED ORDER — METHOCARBAMOL 1000 MG/10ML IJ SOLN
500.0000 mg | Freq: Four times a day (QID) | INTRAVENOUS | Status: DC | PRN
  Administered 2017-06-22: 12:00:00 500 mg via INTRAVENOUS
  Filled 2017-06-22: qty 550

## 2017-06-22 MED ORDER — PROPOFOL 10 MG/ML IV BOLUS
INTRAVENOUS | Status: AC
  Filled 2017-06-22: qty 20

## 2017-06-22 MED ORDER — HYDROCHLOROTHIAZIDE 12.5 MG PO CAPS
12.5000 mg | ORAL_CAPSULE | Freq: Every day | ORAL | Status: DC
  Administered 2017-06-23 – 2017-06-25 (×2): 12.5 mg via ORAL
  Filled 2017-06-22 (×3): qty 1

## 2017-06-22 MED ORDER — ISOPROPYL ALCOHOL 70 % SOLN
Status: AC
  Filled 2017-06-22: qty 480

## 2017-06-22 MED ORDER — ANASTROZOLE 1 MG PO TABS
1.0000 mg | ORAL_TABLET | Freq: Every day | ORAL | Status: DC
  Administered 2017-06-23 – 2017-06-25 (×3): 1 mg via ORAL
  Filled 2017-06-22 (×3): qty 1

## 2017-06-22 MED ORDER — PANTOPRAZOLE SODIUM 40 MG PO TBEC
40.0000 mg | DELAYED_RELEASE_TABLET | Freq: Every day | ORAL | Status: DC
  Administered 2017-06-23 – 2017-06-25 (×3): 40 mg via ORAL
  Filled 2017-06-22 (×3): qty 1

## 2017-06-22 MED ORDER — SODIUM CHLORIDE 0.9 % IV SOLN: INTRAVENOUS | Status: DC

## 2017-06-22 MED ORDER — SODIUM CHLORIDE 0.9 % IR SOLN
Status: DC | PRN
Start: 2017-06-22 — End: 2017-06-22
  Administered 2017-06-22: 3000 mL

## 2017-06-22 MED ORDER — SODIUM CHLORIDE 0.9 % IJ SOLN
INTRAMUSCULAR | Status: AC
  Filled 2017-06-22: qty 50

## 2017-06-22 MED ORDER — ACETAMINOPHEN 325 MG PO TABS
325.0000 mg | ORAL_TABLET | Freq: Four times a day (QID) | ORAL | Status: DC | PRN
  Administered 2017-06-23: 20:00:00 325 mg via ORAL
  Administered 2017-06-23 – 2017-06-24 (×2): 650 mg via ORAL
  Filled 2017-06-22 (×3): qty 2

## 2017-06-22 MED ORDER — ONDANSETRON HCL 4 MG/2ML IJ SOLN
INTRAMUSCULAR | Status: AC
  Filled 2017-06-22: qty 2

## 2017-06-22 MED ORDER — FENTANYL CITRATE (PF) 100 MCG/2ML IJ SOLN
INTRAMUSCULAR | Status: DC | PRN
  Administered 2017-06-22: 50 ug via INTRAVENOUS

## 2017-06-22 MED ORDER — STERILE WATER FOR IRRIGATION IR SOLN
Status: DC | PRN
  Administered 2017-06-22: 2000 mL

## 2017-06-22 MED ORDER — CARBOXYMETHYLCELLUL-GLYCERIN 0.5-0.9 % OP SOLN: 1.0000 [drp] | Freq: Every day | OPHTHALMIC | Status: DC | PRN

## 2017-06-22 MED ORDER — PROPOFOL 10 MG/ML IV BOLUS
INTRAVENOUS | Status: DC | PRN
  Administered 2017-06-22: 30 mg via INTRAVENOUS
  Administered 2017-06-22: 20 mg via INTRAVENOUS

## 2017-06-22 MED ORDER — PHENOL 1.4 % MT LIQD: 1.0000 | OROMUCOSAL | Status: DC | PRN

## 2017-06-22 MED ORDER — ONDANSETRON HCL 4 MG/2ML IJ SOLN: 4.0000 mg | Freq: Four times a day (QID) | INTRAMUSCULAR | Status: DC | PRN

## 2017-06-22 MED ORDER — CHLORHEXIDINE GLUCONATE 4 % EX LIQD: 60.0000 mL | Freq: Once | CUTANEOUS | Status: DC

## 2017-06-22 MED ORDER — DEXAMETHASONE SODIUM PHOSPHATE 10 MG/ML IJ SOLN
10.0000 mg | Freq: Once | INTRAMUSCULAR | Status: AC
  Administered 2017-06-23: 10 mg via INTRAVENOUS
  Filled 2017-06-22: qty 1

## 2017-06-22 MED ORDER — METOCLOPRAMIDE HCL 5 MG PO TABS: 5.0000 mg | ORAL_TABLET | Freq: Three times a day (TID) | ORAL | Status: DC | PRN

## 2017-06-22 MED ORDER — LIDOCAINE 2% (20 MG/ML) 5 ML SYRINGE
INTRAMUSCULAR | Status: AC
  Filled 2017-06-22: qty 5

## 2017-06-22 MED ORDER — KETOROLAC TROMETHAMINE 15 MG/ML IJ SOLN: 7.5000 mg | Freq: Four times a day (QID) | INTRAMUSCULAR | Status: DC

## 2017-06-22 MED ORDER — ONDANSETRON HCL 4 MG/2ML IJ SOLN
INTRAMUSCULAR | Status: DC | PRN
  Administered 2017-06-22: 4 mg via INTRAVENOUS

## 2017-06-22 MED ORDER — LIDOCAINE 2% (20 MG/ML) 5 ML SYRINGE
INTRAMUSCULAR | Status: DC | PRN
  Administered 2017-06-22: 50 mg via INTRAVENOUS

## 2017-06-22 MED ORDER — ALPRAZOLAM 0.5 MG PO TABS
0.5000 mg | ORAL_TABLET | Freq: Every day | ORAL | Status: DC | PRN
  Administered 2017-06-22 – 2017-06-24 (×3): 0.5 mg via ORAL
  Filled 2017-06-22 (×3): qty 1

## 2017-06-22 MED ORDER — MAGNESIUM CITRATE PO SOLN: 1.0000 | Freq: Once | ORAL | Status: DC | PRN

## 2017-06-22 MED ORDER — KETOROLAC TROMETHAMINE 15 MG/ML IJ SOLN
7.5000 mg | Freq: Four times a day (QID) | INTRAMUSCULAR | Status: AC
  Administered 2017-06-22 – 2017-06-23 (×4): 7.5 mg via INTRAVENOUS
  Filled 2017-06-22 (×4): qty 1

## 2017-06-22 MED ORDER — MIDAZOLAM HCL 2 MG/2ML IJ SOLN
INTRAMUSCULAR | Status: AC
  Filled 2017-06-22: qty 2

## 2017-06-22 MED ORDER — POLYETHYLENE GLYCOL 3350 17 G PO PACK: 17.0000 g | PACK | Freq: Every day | ORAL | Status: DC | PRN

## 2017-06-22 MED ORDER — LORATADINE 10 MG PO TABS
10.0000 mg | ORAL_TABLET | Freq: Every day | ORAL | Status: DC
  Administered 2017-06-23 – 2017-06-24 (×2): 10 mg via ORAL
  Filled 2017-06-22 (×3): qty 1

## 2017-06-22 MED ORDER — AMPHETAMINE-DEXTROAMPHETAMINE 30 MG PO TABS: 30.0000 mg | ORAL_TABLET | Freq: Every day | ORAL | Status: DC

## 2017-06-22 MED ORDER — PRAVASTATIN SODIUM 20 MG PO TABS
20.0000 mg | ORAL_TABLET | Freq: Every day | ORAL | Status: DC
  Administered 2017-06-23 – 2017-06-25 (×3): 20 mg via ORAL
  Filled 2017-06-22 (×3): qty 1

## 2017-06-22 SURGICAL SUPPLY — 66 items
BAG ZIPLOCK 12X15 (MISCELLANEOUS) ×3 IMPLANT
BANDAGE ACE 4X5 VEL STRL LF (GAUZE/BANDAGES/DRESSINGS) ×3 IMPLANT
BANDAGE ACE 6X5 VEL STRL LF (GAUZE/BANDAGES/DRESSINGS) ×3 IMPLANT
BLADE SAW RECIPROCATING 77.5 (BLADE) ×3 IMPLANT
CHLORAPREP W/TINT 26ML (MISCELLANEOUS) ×6 IMPLANT
COMPONENT TRI CR FEM SZ5 KNEE (Orthopedic Implant) ×1 IMPLANT
COVER SURGICAL LIGHT HANDLE (MISCELLANEOUS) ×3 IMPLANT
CUFF TOURN SGL QUICK 34 (TOURNIQUET CUFF) ×2
CUFF TRNQT CYL 34X4X40X1 (TOURNIQUET CUFF) ×1 IMPLANT
DECANTER SPIKE VIAL GLASS SM (MISCELLANEOUS) ×3 IMPLANT
DERMABOND ADVANCED (GAUZE/BANDAGES/DRESSINGS) ×2
DERMABOND ADVANCED .7 DNX12 (GAUZE/BANDAGES/DRESSINGS) ×1 IMPLANT
DRAPE SHEET LG 3/4 BI-LAMINATE (DRAPES) ×6 IMPLANT
DRAPE U-SHAPE 47X51 STRL (DRAPES) ×3 IMPLANT
DRSG AQUACEL AG ADV 3.5X10 (GAUZE/BANDAGES/DRESSINGS) ×3 IMPLANT
DRSG TEGADERM 4X4.75 (GAUZE/BANDAGES/DRESSINGS) IMPLANT
ELECT BLADE TIP CTD 4 INCH (ELECTRODE) ×3 IMPLANT
ELECT REM PT RETURN 15FT ADLT (MISCELLANEOUS) ×3 IMPLANT
EVACUATOR 1/8 PVC DRAIN (DRAIN) IMPLANT
GAUZE SPONGE 4X4 12PLY STRL (GAUZE/BANDAGES/DRESSINGS) ×3 IMPLANT
GLOVE BIO SURGEON STRL SZ8.5 (GLOVE) ×6 IMPLANT
GLOVE BIOGEL PI IND STRL 6.5 (GLOVE) ×2 IMPLANT
GLOVE BIOGEL PI IND STRL 7.0 (GLOVE) ×5 IMPLANT
GLOVE BIOGEL PI IND STRL 8 (GLOVE) ×1 IMPLANT
GLOVE BIOGEL PI IND STRL 8.5 (GLOVE) ×1 IMPLANT
GLOVE BIOGEL PI INDICATOR 6.5 (GLOVE) ×4
GLOVE BIOGEL PI INDICATOR 7.0 (GLOVE) ×10
GLOVE BIOGEL PI INDICATOR 8 (GLOVE) ×2
GLOVE BIOGEL PI INDICATOR 8.5 (GLOVE) ×2
GLOVE ECLIPSE 7.5 STRL STRAW (GLOVE) ×6 IMPLANT
GOWN SPEC L3 XXLG W/TWL (GOWN DISPOSABLE) ×6 IMPLANT
GOWN STRL REUS W/TWL XL LVL3 (GOWN DISPOSABLE) ×9 IMPLANT
HANDPIECE INTERPULSE COAX TIP (DISPOSABLE) ×2
HOOD PEEL AWAY FLYTE STAYCOOL (MISCELLANEOUS) ×9 IMPLANT
INSERT TIB 5 9XBRNG CRCTE RTN (Insert) ×1 IMPLANT
KNEE INSERT TIB BRG (Insert) ×2 IMPLANT
KNEE PATELLA ASYMMETRIC 10X32 (Knees) ×3 IMPLANT
KNEE TIBIAL COMPONENT SZ5 (Knees) ×3 IMPLANT
MARKER SKIN DUAL TIP RULER LAB (MISCELLANEOUS) ×3 IMPLANT
NEEDLE SPNL 18GX3.5 QUINCKE PK (NEEDLE) ×3 IMPLANT
PACK TOTAL KNEE CUSTOM (KITS) ×3 IMPLANT
PADDING CAST COTTON 6X4 STRL (CAST SUPPLIES) ×3 IMPLANT
POSITIONER SURGICAL ARM (MISCELLANEOUS) ×3 IMPLANT
SAW OSC TIP CART 19.5X105X1.3 (SAW) ×3 IMPLANT
SEALER BIPOLAR AQUA 6.0 (INSTRUMENTS) ×3 IMPLANT
SET HNDPC FAN SPRY TIP SCT (DISPOSABLE) ×1 IMPLANT
SET PAD KNEE POSITIONER (MISCELLANEOUS) ×3 IMPLANT
SPONGE DRAIN TRACH 4X4 STRL 2S (GAUZE/BANDAGES/DRESSINGS) IMPLANT
SPONGE LAP 18X18 X RAY DECT (DISPOSABLE) IMPLANT
SUCTION FRAZIER HANDLE 12FR (TUBING)
SUCTION TUBE FRAZIER 12FR DISP (TUBING) IMPLANT
SUT MNCRL AB 3-0 PS2 18 (SUTURE) ×6 IMPLANT
SUT MON AB 2-0 CT1 36 (SUTURE) ×3 IMPLANT
SUT STRATAFIX PDO 1 14 VIOLET (SUTURE) ×2
SUT STRATFX PDO 1 14 VIOLET (SUTURE) ×1
SUT VIC AB 1 CT1 36 (SUTURE) ×6 IMPLANT
SUT VIC AB 2-0 CT1 27 (SUTURE) ×2
SUT VIC AB 2-0 CT1 TAPERPNT 27 (SUTURE) ×1 IMPLANT
SUTURE STRATFX PDO 1 14 VIOLET (SUTURE) ×1 IMPLANT
SYR 50ML LL SCALE MARK (SYRINGE) IMPLANT
TOWER CARTRIDGE SMART MIX (DISPOSABLE) IMPLANT
TRAY FOLEY CATH 14FRSI W/METER (CATHETERS) ×3 IMPLANT
TRAY FOLEY W/METER SILVER 16FR (SET/KITS/TRAYS/PACK) IMPLANT
TRI CRUC RET FEM SZ5 KNEE (Orthopedic Implant) ×3 IMPLANT
WRAP KNEE MAXI GEL POST OP (GAUZE/BANDAGES/DRESSINGS) ×3 IMPLANT
YANKAUER SUCT BULB TIP 10FT TU (MISCELLANEOUS) ×3 IMPLANT

## 2017-06-22 NOTE — Transfer of Care (Signed)
Immediate Anesthesia Transfer of Care Note  Patient: Jill Austin  Procedure(s) Performed: Procedure(s) with comments: RIGHT TOTAL KNEE ARTHROPLASTY WITH COMPUTR NAVIGATION (Right) - Needs RNFA  Patient Location: PACU  Anesthesia Type:Regional  Level of Consciousness:  sedated, patient cooperative and responds to stimulation  Airway & Oxygen Therapy:Patient Spontanous Breathing and Patient connected to face mask oxgen  Post-op Assessment:  Report given to PACU RN and Post -op Vital signs reviewed and stable  Post vital signs:  Reviewed and stable  Last Vitals:  Vitals:   06/22/17 0624  BP: (!) 163/82  Pulse: 72  Resp: 18  Temp: 36.8 C  SpO2: 38%    Complications: No apparent anesthesia complications

## 2017-06-22 NOTE — Evaluation (Signed)
Physical Therapy Evaluation Patient Details Name: Jill Austin MRN: [id number] DOB: 07-01-1945 Today's Date: 06/22/2017   History of Present Illness  Pt is a 72 year old female s/p R TKA with hx of breast cancer  Clinical Impression  Pt is s/p TKA resulting in the deficits listed below (see PT Problem List).  Pt will benefit from skilled PT to increase their independence and safety with mobility to allow discharge to the venue listed below.  Pt assisted with ambulating short distance POD #0 and did have some dizziness, vitals below in mobility sections (low BP).  Pt plans to d/c home with spouse and daughters assisting.     Follow Up Recommendations Follow surgeon's recommendation for DC plan and follow-up therapies    Equipment Recommendations  None recommended by PT    Recommendations for Other Services       Precautions / Restrictions Precautions Precautions: Fall;Knee Restrictions Other Position/Activity Restrictions: WBAT      Mobility  Bed Mobility Overal bed mobility: Needs Assistance Bed Mobility: Supine to Sit     Supine to sit: Min assist     General bed mobility comments: assist for R LE  Transfers Overall transfer level: Needs assistance Equipment used: Rolling walker (2 wheeled) Transfers: Sit to/from Stand Sit to Stand: Min assist         General transfer comment: verbal cues for UE and LE positioning, assist to control descent - pt a little dizzy  Ambulation/Gait Ambulation/Gait assistance: Min guard Ambulation Distance (Feet): 40 Feet Assistive device: Rolling walker (2 wheeled) Gait Pattern/deviations: Step-to pattern;Decreased stance time - right;Antalgic     General Gait Details: verbal cues for sequence, RW positioning, step length, pt with dizziness upon returning to room; vitals 103/50 mmHg, 68 bpm, Spo2 96% room air; BP improved to 117/64 mmHg after a few minutes (RN notified)  Stairs            Wheelchair Mobility     Modified Rankin (Stroke Patients Only)       Balance                                             Pertinent Vitals/Pain Pain Assessment: 0-10 Pain Score: 3  Pain Location: R knee Pain Descriptors / Indicators: Aching;Sore Pain Intervention(s): Monitored during session;Premedicated before session;Limited activity within patient's tolerance;Repositioned    Home Living Family/patient expects to be discharged to:: Private residence Living Arrangements: Spouse/significant other   Type of Home: House Home Access: Stairs to enter   Technical brewer of Steps: 1 Home Layout: One level Home Equipment: Environmental consultant - 2 wheels      Prior Function Level of Independence: Independent               Hand Dominance        Extremity/Trunk Assessment        Lower Extremity Assessment Lower Extremity Assessment: RLE deficits/detail RLE Deficits / Details: unable to perform SLR, ROM TBA, able to perform ankle pumps       Communication   Communication: No difficulties  Cognition Arousal/Alertness: Awake/alert Behavior During Therapy: WFL for tasks assessed/performed Overall Cognitive Status: Within Functional Limits for tasks assessed  General Comments      Exercises     Assessment/Plan    PT Assessment Patient needs continued PT services  PT Problem List Decreased range of motion;Decreased strength;Decreased mobility;Decreased knowledge of use of DME;Pain       PT Treatment Interventions Stair training;Gait training;Therapeutic exercise;Functional mobility training;DME instruction;Therapeutic activities;Patient/family education    PT Goals (Current goals can be found in the Care Plan section)  Acute Rehab PT Goals PT Goal Formulation: With patient Time For Goal Achievement: 06/27/17 Potential to Achieve Goals: Good    Frequency 7X/week   Barriers to discharge         Co-evaluation               AM-PAC PT "6 Clicks" Daily Activity  Outcome Measure Difficulty turning over in bed (including adjusting bedclothes, sheets and blankets)?: A Lot Difficulty moving from lying on back to sitting on the side of the bed? : Unable Difficulty sitting down on and standing up from a chair with arms (e.g., wheelchair, bedside commode, etc,.)?: Unable Help needed moving to and from a bed to chair (including a wheelchair)?: A Little Help needed walking in hospital room?: A Little Help needed climbing 3-5 steps with a railing? : A Lot 6 Click Score: 12    End of Session Equipment Utilized During Treatment: Gait belt Activity Tolerance: Patient tolerated treatment well Patient left: in chair;with call bell/phone within reach;with family/visitor present Nurse Communication: Mobility status PT Visit Diagnosis: Other abnormalities of gait and mobility (R26.89);Pain Pain - Right/Left: Right Pain - part of body: Knee    Time: 7371-0626 PT Time Calculation (min) (ACUTE ONLY): 18 min   Charges:   PT Evaluation $PT Eval Low Complexity: 1 Low     PT G Codes:        Carmelia Bake, PT, DPT 06/22/2017 Pager: [phone]  York Ram E 06/22/2017, 4:16 PM

## 2017-06-22 NOTE — Discharge Instructions (Signed)
° °Dr. Koda Defrank °Total Joint Specialist °[facility] °[address] °[place], [place] [address] °[phone] ° °TOTAL KNEE REPLACEMENT POSTOPERATIVE DIRECTIONS ° ° ° °Knee Rehabilitation, Guidelines Following Surgery  °Results after knee surgery are often greatly improved when you follow the exercise, range of motion and muscle strengthening exercises prescribed by your doctor. Safety measures are also important to protect the knee from further injury. Any time any of these exercises cause you to have increased pain or swelling in your knee joint, decrease the amount until you are comfortable again and slowly increase them. If you have problems or questions, call your caregiver or physical therapist for advice.  ° °WEIGHT BEARING °Weight bearing as tolerated with assist device (walker, cane, etc) as directed, use it as long as suggested by your surgeon or therapist, typically at least 4-6 weeks. ° °HOME CARE INSTRUCTIONS  °Remove items at home which could result in a fall. This includes throw rugs or furniture in walking pathways.  °Continue medications as instructed at time of discharge. °You may have some home medications which will be placed on hold until you complete the course of blood thinner medication.  °You may start showering once you are discharged home but do not submerge the incision under water. Just pat the incision dry and apply a dry gauze dressing on daily. °Walk with walker as instructed.  °You may resume a sexual relationship in one month or when given the OK by your doctor.  °· Use walker as long as suggested by your caregivers. °· Avoid periods of inactivity such as sitting longer than an hour when not asleep. This helps prevent blood clots.  °You may put full weight on your legs and walk as much as is comfortable.  °You may return to work once you are cleared by your doctor.  °Do not drive a car for 6 weeks or until released by you surgeon.  °· Do not drive  while taking narcotics.  °Wear the elastic stockings for three weeks following surgery during the day but you may remove then at night. °Make sure you keep all of your appointments after your operation with all of your doctors and caregivers. You should call the office at the above phone number and make an appointment for approximately two weeks after the date of your surgery. °Do not remove your surgical dressing. The dressing is waterproof; you may take showers in 3 days, but do not take tub baths or submerge the dressing. °Please pick up a stool softener and laxative for home use as long as you are requiring pain medications. °· ICE to the affected knee every three hours for 30 minutes at a time and then as needed for pain and swelling.  Continue to use ice on the knee for pain and swelling from surgery. You may notice swelling that will progress down to the foot and ankle.  This is normal after surgery.  Elevate the leg when you are not up walking on it.   °It is important for you to complete the blood thinner medication as prescribed by your doctor. °· Continue to use the breathing machine which will help keep your temperature down.  It is common for your temperature to cycle up and down following surgery, especially at night when you are not up moving around and exerting yourself.  The breathing machine keeps your lungs expanded and your temperature down. ° °RANGE OF MOTION AND STRENGTHENING EXERCISES  °Rehabilitation of the knee is important following   a knee injury or an operation. After just a few days of immobilization, the muscles of the thigh which control the knee become weakened and shrink (atrophy). Knee exercises are designed to build up the tone and strength of the thigh muscles and to improve knee motion. Often times heat used for twenty to thirty minutes before working out will loosen up your tissues and help with improving the range of motion but do not use heat for the first two weeks following  surgery. These exercises can be done on a training (exercise) mat, on the floor, on a table or on a bed. Use what ever works the best and is most comfortable for you Knee exercises include:  °Leg Lifts - While your knee is still immobilized in a splint or cast, you can do straight leg raises. Lift the leg to 60 degrees, hold for 3 sec, and slowly lower the leg. Repeat 10-20 times 2-3 times daily. Perform this exercise against resistance later as your knee gets better.  °Quad and Hamstring Sets - Tighten up the muscle on the front of the thigh (Quad) and hold for 5-10 sec. Repeat this 10-20 times hourly. Hamstring sets are done by pushing the foot backward against an object and holding for 5-10 sec. Repeat as with quad sets.  °A rehabilitation program following serious knee injuries can speed recovery and prevent re-injury in the future due to weakened muscles. Contact your doctor or a physical therapist for more information on knee rehabilitation.  ° °SKILLED REHAB INSTRUCTIONS: °If the patient is transferred to a skilled rehab facility following release from the hospital, a list of the current medications will be sent to the facility for the patient to continue.  When discharged from the skilled rehab facility, please have the facility set up the patient's Home Health Physical Therapy prior to being released. Also, the skilled facility will be responsible for providing the patient with their medications at time of release from the facility to include their pain medication, the muscle relaxants, and their blood thinner medication. If the patient is still at the rehab facility at time of the two week follow up appointment, the skilled rehab facility will also need to assist the patient in arranging follow up appointment in our office and any transportation needs. ° °MAKE SURE YOU:  °Understand these instructions.  °Will watch your condition.  °Will get help right away if you are not doing well or get worse.   ° ° °Pick up stool softner and laxative for home use following surgery while on pain medications. °Do NOT remove your dressing. You may shower.  °Do not take tub baths or submerge incision under water. °May shower starting three days after surgery. °Please use a clean towel to pat the incision dry following showers. °Continue to use ice for pain and swelling after surgery. °Do not use any lotions or creams on the incision until instructed by your surgeon. ° °

## 2017-06-22 NOTE — Anesthesia Preprocedure Evaluation (Signed)
Anesthesia Evaluation  Patient identified by MRN, date of birth, ID band Patient awake    Reviewed: Allergy & Precautions, NPO status , Patient's Chart, lab work & pertinent test results  History of Anesthesia Complications (+) PONV  Airway Mallampati: II  TM Distance: >3 FB     Dental   Pulmonary    breath sounds clear to auscultation       Cardiovascular hypertension, + Peripheral Vascular Disease   Rhythm:Regular Rate:Normal     Neuro/Psych    GI/Hepatic Neg liver ROS, GERD  ,  Endo/Other    Renal/GU negative Renal ROS     Musculoskeletal  (+) Arthritis ,   Abdominal   Peds  Hematology   Anesthesia Other Findings   Reproductive/Obstetrics                             Anesthesia Physical Anesthesia Plan  ASA: III  Anesthesia Plan: General   Post-op Pain Management:    Induction: Intravenous  PONV Risk Score and Plan: 4 or greater and Treatment may vary due to age or medical condition, Ondansetron, Dexamethasone and Midazolam  Airway Management Planned:   Additional Equipment:   Intra-op Plan:   Post-operative Plan:   Informed Consent: I have reviewed the patients History and Physical, chart, labs and discussed the procedure including the risks, benefits and alternatives for the proposed anesthesia with the patient or authorized representative who has indicated his/her understanding and acceptance.   Dental advisory given  Plan Discussed with: CRNA and Anesthesiologist  Anesthesia Plan Comments:         Anesthesia Quick Evaluation

## 2017-06-22 NOTE — Op Note (Signed)
OPERATIVE REPORT  SURGEON: Rod Can, MD   ASSISTANT: Sherlean Foot, RNFA.  PREOPERATIVE DIAGNOSIS: Right knee arthritis.   POSTOPERATIVE DIAGNOSIS: Right knee arthritis.   PROCEDURE: Right total knee arthroplasty.   IMPLANTS: Stryker Triathlon CR femur, size 5. Stryker Tritanium tibia, size 5. X3 polyethelyene insert, size 9 mm, CR. 3 button asymmetric patella, size 32 mm.  ANESTHESIA:  Spinal  TOURNIQUET TIME: Not utilized.   ESTIMATED BLOOD LOSS:-400 mL    ANTIBIOTICS: 900 mg clindamycin.  DRAINS: None.  COMPLICATIONS: None   CONDITION: PACU - hemodynamically stable.   BRIEF CLINICAL NOTE: Jill Austin is a 72 y.o. female with a long-standing history of Right knee arthritis. After failing conservative management, the patient was indicated for total knee arthroplasty. The risks, benefits, and alternatives to the procedure were explained, and the patient elected to proceed.  PROCEDURE IN DETAIL: Surgical site was marked in the pre-op holding area. Once inside the operative room, spinal anesthesia was obtained, and a foley catheter was inserted. The patient was then positioned, a nonsterile tourniquet was placed, and the lower extremity was prepped and draped in the normal sterile surgical fashion. A time-out was called verifying side and site of surgery. The patient received IV antibiotics within 60 minutes of beginning the procedure. The tourniquet was not utilized.  An anterior approach to the knee was performed utilizing a midvastus arthrotomy. A medial release was performed and the patellar fat pad was excised. Stryker navigation was used to cut the distal femur perpendicular to the mechanical axis. A freehand patellar resection was performed, and the patella was sized an prepared with 3 lug holes.  Nagivation was used to make a neutral proximal tibia resection,  taking 3 mm of bone from the less affected medial side with 3 degrees of slope. The menisci were excised. A spacer block was placed, and the alignment and balance in extension were confirmed.   The distal femur was sized using the 3-degree external rotation guide referencing the posterior femoral cortex. The appropriate 4-in-1 cutting block was pinned into place. Rotation was checked using Whiteside's line, the epicondylar axis, and then confirmed with a spacer block in flexion. The remaining femoral cuts were performed, taking care to protect the MCL.  The tibia was sized and the trial tray was pinned into place. The remaining trail components were inserted. The knee was stable to varus and valgus stress through a full range of motion. The patella tracked centrally, and the PCL was well balanced. The trial components were removed, and the proximal tibial surface was prepared. Final components were impacted into place. The knee was tested for a final time and found to be well balanced.  The wound was copiously irrigated with a dilute betadine solution followed by normal saline with pulse lavage. Marcaine solution was injected into the periarticular soft tissue. The wound was closed in layers using #1 Vicryl and Stratafix for the fascia, 2-0 Vicryl for the subcutaneous fat, 2-0 Monocryl for the deep dermal layer, 3-0 running Monocryl subcuticular Stitch, and Dermabond for the skin. Once the glue was fully dried, an Aquacell Ag and compressive dressing were applied. Tthe patient was transported to the recovery room in stable condition. Sponge, needle, and instrument counts were correct at the end of the case x2. The patient tolerated the procedure well and there were no known complications.

## 2017-06-22 NOTE — Anesthesia Procedure Notes (Addendum)
Spinal  Patient location during procedure: OR Start time: 06/22/2017 7:40 AM End time: 06/22/2017 7:50 AM Reason for block: at surgeon's request Staffing Resident/CRNA: Anne Fu, CRNA Performed: resident/CRNA  Preanesthetic Checklist Completed: patient identified, site marked, surgical consent, pre-op evaluation, timeout performed, IV checked, risks and benefits discussed and monitors and equipment checked Spinal Block Patient position: sitting Prep: DuraPrep Patient monitoring: heart rate, continuous pulse ox and blood pressure Approach: midline Location: L2-3 Injection technique: single-shot Needle Needle type: Pencan  Needle gauge: 22 G Needle length: 9 cm Assessment Sensory level: T6 Additional Notes Expiration date of kit checked and confirmed. Patient tolerated procedure well, without complications. X 2 attempt with noted clear CSF return. Loss of motor and sensory on exam post injection. Placed by Carlota Raspberry MD

## 2017-06-22 NOTE — Interval H&P Note (Signed)
History and Physical Interval Note:  06/22/2017 7:31 AM  Jill Austin  has presented today for surgery, with the diagnosis of Degenerative joint disease right knee  The various methods of treatment have been discussed with the patient and family. After consideration of risks, benefits and other options for treatment, the patient has consented to  Procedure(s) with comments: RIGHT TOTAL KNEE ARTHROPLASTY WITH COMPUTR NAVIGATION (Right) - Needs RNFA as a surgical intervention .  The patient's history has been reviewed, patient examined, no change in status, stable for surgery.  I have reviewed the patient's chart and labs.  Questions were answered to the patient's satisfaction.     Hilton Cork Clarisse Rodriges

## 2017-06-22 NOTE — Anesthesia Postprocedure Evaluation (Signed)
Anesthesia Post Note  Patient: Jill Austin  Procedure(s) Performed: RIGHT TOTAL KNEE ARTHROPLASTY WITH COMPUTR NAVIGATION (Right Knee)     Patient location during evaluation: PACU Anesthesia Type: Spinal Level of consciousness: awake Pain management: pain level controlled Cardiovascular status: stable Anesthetic complications: no    Last Vitals:  Vitals Value Taken Time  BP    Temp    Pulse 66 06/22/2017 10:32 AM  Resp    SpO2 100 % 06/22/2017 10:32 AM  Vitals shown include unvalidated device data.  Last Pain:  Vitals:   06/22/17 0648  TempSrc:   PainSc: 0-No pain                 Lekeisha Arenas

## 2017-06-23 ENCOUNTER — Encounter (HOSPITAL_COMMUNITY): Payer: Self-pay | Admitting: Orthopedic Surgery

## 2017-06-23 LAB — CBC
HCT: 28.4 % — ABNORMAL LOW (ref 36.0–46.0)
Hemoglobin: 9.4 g/dL — ABNORMAL LOW (ref 12.0–15.0)
MCH: 30.1 pg (ref 26.0–34.0)
MCHC: 33.1 g/dL (ref 30.0–36.0)
MCV: 91 fL (ref 78.0–100.0)
PLATELETS: 132 10*3/uL — AB (ref 150–400)
RBC: 3.12 MIL/uL — ABNORMAL LOW (ref 3.87–5.11)
RDW: 13.7 % (ref 11.5–15.5)
WBC: 7.4 10*3/uL (ref 4.0–10.5)

## 2017-06-23 LAB — BASIC METABOLIC PANEL
ANION GAP: 8 (ref 5–15)
BUN: 15 mg/dL (ref 6–20)
CALCIUM: 8 mg/dL — AB (ref 8.9–10.3)
CO2: 23 mmol/L (ref 22–32)
Chloride: 109 mmol/L (ref 101–111)
Creatinine, Ser: 0.54 mg/dL (ref 0.44–1.00)
Glucose, Bld: 112 mg/dL — ABNORMAL HIGH (ref 65–99)
Potassium: 3.1 mmol/L — ABNORMAL LOW (ref 3.5–5.1)
SODIUM: 140 mmol/L (ref 135–145)

## 2017-06-23 MED ORDER — ASPIRIN 81 MG PO CHEW: 81.0000 mg | CHEWABLE_TABLET | Freq: Two times a day (BID) | ORAL | 1 refills | Status: DC

## 2017-06-23 MED ORDER — SENNA 8.6 MG PO TABS: 2.0000 | ORAL_TABLET | Freq: Every day | ORAL | 0 refills | Status: DC

## 2017-06-23 MED ORDER — ONDANSETRON HCL 4 MG PO TABS: 4.0000 mg | ORAL_TABLET | Freq: Four times a day (QID) | ORAL | 0 refills | Status: DC | PRN

## 2017-06-23 MED ORDER — ALUM & MAG HYDROXIDE-SIMETH 200-200-20 MG/5ML PO SUSP: 30.0000 mL | ORAL | Status: DC | PRN

## 2017-06-23 MED ORDER — DOCUSATE SODIUM 100 MG PO CAPS: 100.0000 mg | ORAL_CAPSULE | Freq: Two times a day (BID) | ORAL | 1 refills | Status: DC

## 2017-06-23 MED ORDER — HYDROMORPHONE HCL 2 MG PO TABS: 1.0000 mg | ORAL_TABLET | ORAL | 0 refills | Status: DC | PRN

## 2017-06-23 MED ORDER — ACETAMINOPHEN 325 MG PO TABS: 325.0000 mg | ORAL_TABLET | Freq: Four times a day (QID) | ORAL | Status: DC | PRN

## 2017-06-23 NOTE — Progress Notes (Signed)
    Subjective:  Patient reports pain as mild to moderate.  Denies N/V/CP/SOB. No c/o.  Objective:   VITALS:   Vitals:   06/22/17 1707 06/22/17 2133 06/23/17 0207 06/23/17 0651  BP: 111/60 132/62 128/65 133/72  Pulse: 70  65 64  Resp: 16 17  16   Temp: 98 F (36.7 C) 98.1 F (36.7 C) 98.3 F (36.8 C) 98.3 F (36.8 C)  TempSrc: Oral Oral Oral Oral  SpO2: 99% 95% 96% 97%  Weight:      Height:        NAD ABD soft Sensation intact distally Intact pulses distally Dorsiflexion/Plantar flexion intact Incision: dressing C/D/I Compartment soft   Lab Results  Component Value Date   WBC 7.4 06/23/2017   HGB 9.4 (L) 06/23/2017   HCT 28.4 (L) 06/23/2017   MCV 91.0 06/23/2017   PLT 132 (L) 06/23/2017   BMET    Component Value Date/Time   NA 140 06/23/2017 0545   NA 139 02/06/2017 1345   K 3.1 (L) 06/23/2017 0545   K 3.7 02/06/2017 1345   CL 109 06/23/2017 0545   CL 103 05/16/2012 0849   CO2 23 06/23/2017 0545   CO2 27 02/06/2017 1345   GLUCOSE 112 (H) 06/23/2017 0545   GLUCOSE 91 02/06/2017 1345   GLUCOSE 105 (H) 05/16/2012 0849   BUN 15 06/23/2017 0545   BUN 15.2 02/06/2017 1345   CREATININE 0.54 06/23/2017 0545   CREATININE 0.8 02/06/2017 1345   CALCIUM 8.0 (L) 06/23/2017 0545   CALCIUM 9.2 02/06/2017 1345   GFRNONAA >60 06/23/2017 0545   GFRAA >60 06/23/2017 0545     Assessment/Plan: 1 Day Post-Op   Principal Problem:   Osteoarthritis of right knee   WBAT with walker DVT ppx: ASA, SCDs, TEDS PO pain control PT/OT Dispo: D/C home tomorrow with outpatient PT    Jill Austin 06/23/2017, 10:33 AM   Rod Can, MD Cell [phone]

## 2017-06-23 NOTE — Discharge Summary (Addendum)
Physician Discharge Summary  Patient ID: Jill Austin MRN: [id number] DOB/AGE: 04-Jun-1945 72 y.o.  Admit date: 06/22/2017 Discharge date: 06/25/2017  Admission Diagnoses:  Osteoarthritis of right knee  Discharge Diagnoses:  Principal Problem:   Osteoarthritis of right knee   Past Medical History:  Diagnosis Date  . Arthritis   . Breast cancer ([place]) 05/08/12   invasive mammary ca, ER/PR+, HER 2 -  . GERD (gastroesophageal reflux disease)   . Hypertension   . PONV (postoperative nausea and vomiting)   . S/P radiation therapy 4-12 wks ago 08/06/12-09/24/12   left breast  . Wears glasses     Surgeries: Procedure(s): RIGHT TOTAL KNEE ARTHROPLASTY WITH COMPUTR NAVIGATION on 06/22/2017   Consultants (if any):   Discharged Condition: Improved  Hospital Course: Jill Austin is an 72 y.o. female who was admitted 06/22/2017 with a diagnosis of Osteoarthritis of right knee and went to the operating room on 06/22/2017 and underwent the above named procedures.    She was given perioperative antibiotics:  Anti-infectives (From admission, onward)   Start     Dose/Rate Route Frequency Ordered Stop   06/22/17 1400  clindamycin (CLEOCIN) IVPB 600 mg     600 mg 100 mL/hr over 30 Minutes Intravenous Every 6 hours 06/22/17 1147 06/22/17 2222   06/22/17 1145  clindamycin (CLEOCIN) IVPB 600 mg  Status:  Discontinued     600 mg 100 mL/hr over 30 Minutes Intravenous Every 6 hours 06/22/17 1136 06/22/17 1147   06/22/17 0636  clindamycin (CLEOCIN) IVPB 900 mg     900 mg 100 mL/hr over 30 Minutes Intravenous On call to O.R. 06/22/17 0636 06/22/17 0800    .  She was given sequential compression devices, early ambulation, and ASA for DVT prophylaxis.  She benefited maximally from the hospital stay and there were no complications.    Recent vital signs:  Vitals:   06/24/17 2100 06/25/17 0418  BP: (!) 129/59 140/68  Pulse: 85 83  Resp: 19 16  Temp: 98.9 F (37.2 C) 99.8 F (37.7 C)  SpO2:  96% 92%    Recent laboratory studies:  Lab Results  Component Value Date   HGB 10.4 (L) 06/25/2017   HGB 10.2 (L) 06/24/2017   HGB 9.4 (L) 06/23/2017   Lab Results  Component Value Date   WBC 6.9 06/25/2017   PLT 192 06/25/2017   No results found for: INR Lab Results  Component Value Date   NA 140 06/23/2017   K 3.1 (L) 06/23/2017   CL 109 06/23/2017   CO2 23 06/23/2017   BUN 15 06/23/2017   CREATININE 0.54 06/23/2017   GLUCOSE 112 (H) 06/23/2017    Discharge Medications:   Allergies as of 06/25/2017      Reactions   Penicillins Other (See Comments)   headache Has patient had a PCN reaction causing immediate rash, facial/tongue/throat swelling, SOB or lightheadedness with hypotension: No Has patient had a PCN reaction causing severe rash involving mucus membranes or skin necrosis: No Has patient had a PCN reaction that required hospitalization: No Has patient had a PCN reaction occurring within the last 10 years: No If all of the above answers are "NO", then may proceed with Cephalosporin use.   Vicodin [hydrocodone-acetaminophen] Other (See Comments)   Hyper   Erythromycin Base Dermatitis      Medication List    STOP taking these medications   pantoprazole 40 MG tablet Commonly known as:  PROTONIX     TAKE these medications  acetaminophen 325 MG tablet Commonly known as:  TYLENOL Take 1-2 tablets (325-650 mg total) by mouth every 6 (six) hours as needed for mild pain (pain score 1-3 or temp > 100.5).   ALPRAZolam 0.5 MG tablet Commonly known as:  XANAX Take 0.5 mg by mouth daily as needed for anxiety or sleep.   amLODipine 5 MG tablet Commonly known as:  NORVASC Take 5 mg by mouth daily.   amphetamine-dextroamphetamine 30 MG tablet Commonly known as:  ADDERALL Take 30 mg by mouth daily.   anastrozole 1 MG tablet Commonly known as:  ARIMIDEX TAKE 1 TABLET BY MOUTH  DAILY   aspirin 81 MG chewable tablet Chew 1 tablet (81 mg total) by mouth 2 (two)  times daily.   CALCIUM 600 + D PO Take 600 mg by mouth daily.   calcium carbonate 500 MG chewable tablet Commonly known as:  TUMS - dosed in mg elemental calcium Chew 1-2 tablets by mouth daily as needed for indigestion or heartburn.   diphenhydrAMINE 25 MG tablet Commonly known as:  BENADRYL Take 50 mg by mouth daily as needed for allergies.   docusate sodium 100 MG capsule Commonly known as:  COLACE Take 1 capsule (100 mg total) by mouth 2 (two) times daily.   fish oil-omega-3 fatty acids 1000 MG capsule Take 1 g by mouth daily.   GAS-X PO Take 1-2 tablets by mouth daily as needed (gas).   hydrochlorothiazide 12.5 MG capsule Commonly known as:  MICROZIDE Take 12.5 mg by mouth daily.   HYDROmorphone 2 MG tablet Commonly known as:  DILAUDID Take 0.5-1 tablets (1-2 mg total) by mouth every 4 (four) hours as needed for severe pain.   loratadine 10 MG tablet Commonly known as:  CLARITIN Take 10 mg by mouth daily.   LUBRICATING EYE DROPS OP Place 1 drop into both eyes daily as needed (dry eyes).   meloxicam 15 MG tablet Commonly known as:  MOBIC Take 15 mg by mouth daily.   multivitamin capsule Take 1 capsule by mouth daily.   OCUVITE PO Take 1 tablet by mouth daily.   omeprazole 20 MG capsule Commonly known as:  PRILOSEC Take 20 mg by mouth daily after breakfast.   ondansetron 4 MG tablet Commonly known as:  ZOFRAN Take 1 tablet (4 mg total) by mouth every 6 (six) hours as needed for nausea.   oxymetazoline 0.05 % nasal spray Commonly known as:  AFRIN Place 1 spray into both nostrils daily as needed for congestion.   pravastatin 20 MG tablet Commonly known as:  PRAVACHOL Take 20 mg by mouth daily.   pseudoephedrine-acetaminophen 30-500 MG Tabs tablet Commonly known as:  TYLENOL SINUS Take 1 tablet by mouth daily as needed (sinuses).   senna 8.6 MG Tabs tablet Commonly known as:  SENOKOT Take 2 tablets (17.2 mg total) by mouth at bedtime.    sertraline 100 MG tablet Commonly known as:  ZOLOFT Take 100 mg by mouth daily.       Diagnostic Studies: Dg Knee Right Port  Result Date: 06/22/2017 CLINICAL DATA:  Status post right total knee replacement. EXAM: PORTABLE RIGHT KNEE - 1-2 VIEW COMPARISON:  None. FINDINGS: AP and lateral views of the right knee demonstrate a total knee prosthesis in satisfactory position and alignment. Associated postoperative air. No fracture or dislocation seen. IMPRESSION: Satisfactory postoperative appearance of a right total knee prosthesis. Electronically Signed   By: Claudie Revering M.D.   On: 06/22/2017 12:55    Disposition:  Follow-up Information    Panayiota Larkin, Aaron Edelman, MD. Schedule an appointment as soon as possible for a visit in 2 weeks.   Specialty:  Orthopedic Surgery Why:  For wound re-check Contact information: [address] [place] [address] [phone]        [facility] Follow up.   Specialty:  [place] Why:  Home Health Physical Therapy- agency will call to arrange initial visit Contact information: [place] [address] [phone]            Signed: Hilton Cork Cane Dubray 06/25/2017, 9:43 PM

## 2017-06-23 NOTE — Progress Notes (Signed)
Physical Therapy Treatment Patient Details Name: Jill Austin MRN: [id number] DOB: 26-Apr-1945 Today's Date: 06/23/2017    History of Present Illness Pt is a 72 year old female s/p R TKA with hx of breast cancer    PT Comments    Pt reports nausea this afternoon however agreeable to perform LE exercises.  Pt requested to use bathroom once exercises completed and then returned to bed.  Pt reports she believes she has sinus headache.  Follow Up Recommendations  Follow surgeon's recommendation for DC plan and follow-up therapies     Equipment Recommendations  None recommended by PT    Recommendations for Other Services       Precautions / Restrictions Precautions Precautions: Fall;Knee Restrictions Other Position/Activity Restrictions: WBAT    Mobility  Bed Mobility Overal bed mobility: Needs Assistance Bed Mobility: Supine to Sit;Sit to Supine     Supine to sit: Min assist Sit to supine: Min assist   General bed mobility comments: assist for R LE  Transfers Overall transfer level: Needs assistance Equipment used: Rolling walker (2 wheeled) Transfers: Sit to/from Stand Sit to Stand: Min guard         General transfer comment: verbal cues for UE and LE positioning  Ambulation/Gait Ambulation/Gait assistance: Min guard Ambulation Distance (Feet): 15 Feet Assistive device: Rolling walker (2 wheeled) Gait Pattern/deviations: Step-to pattern;Decreased stance time - right;Antalgic     General Gait Details: verbal cues for RW positioning, step length, only ambulated to/from bathroom   Stairs            Wheelchair Mobility    Modified Rankin (Stroke Patients Only)       Balance                                            Cognition Arousal/Alertness: Awake/alert Behavior During Therapy: WFL for tasks assessed/performed Overall Cognitive Status: Within Functional Limits for tasks assessed                                         Exercises Total Joint Exercises Ankle Circles/Pumps: AROM;10 reps;Both Quad Sets: AROM;10 reps;Right Short Arc Quad: 10 reps;AAROM;Right Heel Slides: AAROM;10 reps;Right Hip ABduction/ADduction: AROM;10 reps;Right Straight Leg Raises: AAROM;10 reps;Right Goniometric ROM: approximately 35* AAROM knee flexion, limited by pain    General Comments        Pertinent Vitals/Pain Pain Assessment: 0-10 Pain Score: 3  Pain Location: R knee Pain Descriptors / Indicators: Aching;Sore Pain Intervention(s): Limited activity within patient's tolerance;Repositioned;Monitored during session    Home Living                      Prior Function            PT Goals (current goals can now be found in the care plan section) Progress towards PT goals: Progressing toward goals    Frequency    7X/week      PT Plan Current plan remains appropriate    Co-evaluation              AM-PAC PT "6 Clicks" Daily Activity  Outcome Measure  Difficulty turning over in bed (including adjusting bedclothes, sheets and blankets)?: A Lot Difficulty moving from lying on back to sitting on the side of the bed? :  Unable Difficulty sitting down on and standing up from a chair with arms (e.g., wheelchair, bedside commode, etc,.)?: Unable Help needed moving to and from a bed to chair (including a wheelchair)?: A Little Help needed walking in hospital room?: A Little Help needed climbing 3-5 steps with a railing? : A Lot 6 Click Score: 12    End of Session Equipment Utilized During Treatment: Gait belt Activity Tolerance: Patient limited by pain Patient left: with call bell/phone within reach;in bed;with family/visitor present Nurse Communication: Mobility status;Patient requests pain meds PT Visit Diagnosis: Other abnormalities of gait and mobility (R26.89);Pain Pain - Right/Left: Right Pain - part of body: Knee     Time: 1442-1500 PT Time Calculation (min) (ACUTE  ONLY): 18 min  Charges: $Therapeutic Exercise: 8-22 mins                    G Codes:       Carmelia Bake, PT, DPT 06/23/2017 Pager: [phone]  York Ram E 06/23/2017, 3:42 PM

## 2017-06-23 NOTE — Progress Notes (Signed)
Physical Therapy Treatment Patient Details Name: Jill Austin MRN: [id number] DOB: [birth year] Today's Date: 06/23/2017    History of Present Illness Pt is a 72 year old female s/p R TKA with hx of breast cancer    PT Comments    Pt ambulated in hallway and reports increased pain with mobility (RN notified and to bring meds).  Pt reports plan for d/c home tomorrow.  Follow Up Recommendations  Follow surgeon's recommendation for DC plan and follow-up therapies     Equipment Recommendations  None recommended by PT    Recommendations for Other Services       Precautions / Restrictions Precautions Precautions: Fall;Knee Restrictions Other Position/Activity Restrictions: WBAT    Mobility  Bed Mobility Overal bed mobility: Needs Assistance Bed Mobility: Supine to Sit     Supine to sit: Min guard     General bed mobility comments: verbal cues for self assist  Transfers Overall transfer level: Needs assistance Equipment used: Rolling walker (2 wheeled) Transfers: Sit to/from Stand Sit to Stand: Min guard         General transfer comment: verbal cues for UE and LE positioning  Ambulation/Gait Ambulation/Gait assistance: Min guard Ambulation Distance (Feet): 90 Feet Assistive device: Rolling walker (2 wheeled) Gait Pattern/deviations: Step-to pattern;Decreased stance time - right;Antalgic     General Gait Details: verbal cues for sequence, RW positioning, step length, pain increased with ambulating so limited distance (RN notified of request for pain meds)   Stairs            Wheelchair Mobility    Modified Rankin (Stroke Patients Only)       Balance                                            Cognition Arousal/Alertness: Awake/alert Behavior During Therapy: WFL for tasks assessed/performed Overall Cognitive Status: Within Functional Limits for tasks assessed                                        Exercises       General Comments        Pertinent Vitals/Pain Pain Assessment: 0-10 Pain Score: 6  Pain Location: R knee Pain Descriptors / Indicators: Aching;Sore Pain Intervention(s): Repositioned;Limited activity within patient's tolerance;Monitored during session;Patient requesting pain meds-RN notified    Home Living                      Prior Function            PT Goals (current goals can now be found in the care plan section) Progress towards PT goals: Progressing toward goals    Frequency    7X/week      PT Plan Current plan remains appropriate    Co-evaluation              AM-PAC PT "6 Clicks" Daily Activity  Outcome Measure  Difficulty turning over in bed (including adjusting bedclothes, sheets and blankets)?: A Lot Difficulty moving from lying on back to sitting on the side of the bed? : Unable Difficulty sitting down on and standing up from a chair with arms (e.g., wheelchair, bedside commode, etc,.)?: Unable Help needed moving to and from a bed to chair (including a wheelchair)?: A Little Help needed  walking in hospital room?: A Little Help needed climbing 3-5 steps with a railing? : A Lot 6 Click Score: 12    End of Session Equipment Utilized During Treatment: Gait belt Activity Tolerance: Patient limited by pain Patient left: in chair;with call bell/phone within reach;with family/visitor present Nurse Communication: Mobility status;Patient requests pain meds PT Visit Diagnosis: Other abnormalities of gait and mobility (R26.89);Pain Pain - Right/Left: Right Pain - part of body: Knee     Time: 7092-9574 PT Time Calculation (min) (ACUTE ONLY): 21 min  Charges:  $Gait Training: 8-22 mins                    G Codes:       Carmelia Bake, PT, DPT 06/23/2017 Pager: [phone]  York Ram E 06/23/2017, 1:44 PM

## 2017-06-24 LAB — CBC
HEMATOCRIT: 31.1 % — AB (ref 36.0–46.0)
HEMOGLOBIN: 10.2 g/dL — AB (ref 12.0–15.0)
MCH: 30 pg (ref 26.0–34.0)
MCHC: 32.8 g/dL (ref 30.0–36.0)
MCV: 91.5 fL (ref 78.0–100.0)
Platelets: 159 10*3/uL (ref 150–400)
RBC: 3.4 MIL/uL — ABNORMAL LOW (ref 3.87–5.11)
RDW: 13.9 % (ref 11.5–15.5)
WBC: 6.7 10*3/uL (ref 4.0–10.5)

## 2017-06-24 MED ORDER — SODIUM CHLORIDE 0.9 % IV BOLUS (SEPSIS)
250.0000 mL | Freq: Once | INTRAVENOUS | Status: AC
  Administered 2017-06-24: 250 mL via INTRAVENOUS

## 2017-06-24 NOTE — Progress Notes (Signed)
Physical Therapy Treatment Patient Details Name: Jill Austin MRN: [id number] DOB: 1946-03-22 Today's Date: 06/24/2017    History of Present Illness Pt is a 72 year old female s/p R TKA with hx of breast cancer    PT Comments    Pt ambulated in hallway and practiced steps with spouse holding RW.  Pt reports dizziness increased upon completing steps so returned to sitting EOB for end of session.  BP sitting 78/46 mmHg so pt returned to supine and BP 109/63 mmHg, HR 68 bpm.  RN notified.   Follow Up Recommendations  Follow surgeon's recommendation for DC plan and follow-up therapies     Equipment Recommendations  None recommended by PT    Recommendations for Other Services       Precautions / Restrictions Precautions Precautions: Fall;Knee Restrictions Other Position/Activity Restrictions: WBAT    Mobility  Bed Mobility Overal bed mobility: Needs Assistance Bed Mobility: Supine to Sit;Sit to Supine     Supine to sit: Min assist Sit to supine: Min assist   General bed mobility comments: assist for R LE  Transfers Overall transfer level: Needs assistance Equipment used: Rolling walker (2 wheeled) Transfers: Sit to/from Stand Sit to Stand: Min guard         General transfer comment: verbal cues for UE and LE positioning  Ambulation/Gait Ambulation/Gait assistance: Min guard Ambulation Distance (Feet): 80 Feet Assistive device: Rolling walker (2 wheeled) Gait Pattern/deviations: Step-to pattern;Decreased stance time - right;Antalgic     General Gait Details: verbal cues for RW positioning, step length   Stairs Stairs: Yes   Stair Management: Step to pattern;Backwards;With walker Number of Stairs: 2 General stair comments: verbal cues for sequence, RW positioning, safety, spouse present and assisted with RW, pt with increased dizziness after steps so returned to room  Wheelchair Mobility    Modified Rankin (Stroke Patients Only)       Balance                                             Cognition Arousal/Alertness: Awake/alert Behavior During Therapy: WFL for tasks assessed/performed Overall Cognitive Status: Within Functional Limits for tasks assessed                                        Exercises      General Comments        Pertinent Vitals/Pain Pain Assessment: 0-10 Pain Score: 5  Pain Location: R knee Pain Descriptors / Indicators: Aching;Sore Pain Intervention(s): Limited activity within patient's tolerance;Repositioned;Monitored during session;Ice applied    Home Living                      Prior Function            PT Goals (current goals can now be found in the care plan section) Progress towards PT goals: Progressing toward goals    Frequency    7X/week      PT Plan Current plan remains appropriate    Co-evaluation              AM-PAC PT "6 Clicks" Daily Activity  Outcome Measure  Difficulty turning over in bed (including adjusting bedclothes, sheets and blankets)?: A Little Difficulty moving from lying on back to sitting on the  side of the bed? : Unable Difficulty sitting down on and standing up from a chair with arms (e.g., wheelchair, bedside commode, etc,.)?: Unable Help needed moving to and from a bed to chair (including a wheelchair)?: A Little Help needed walking in hospital room?: A Little Help needed climbing 3-5 steps with a railing? : A Little 6 Click Score: 14    End of Session Equipment Utilized During Treatment: Gait belt Activity Tolerance: Treatment limited secondary to medical complications (Comment)(drop in BP) Patient left: with call bell/phone within reach;in bed;with family/visitor present Nurse Communication: Mobility status PT Visit Diagnosis: Other abnormalities of gait and mobility (R26.89);Pain Pain - Right/Left: Right Pain - part of body: Knee     Time: 5329-9242 PT Time Calculation (min) (ACUTE ONLY):  15 min  Charges:  $Gait Training: 8-22 mins                    G Codes:       Carmelia Bake, PT, DPT 06/24/2017 Pager: [phone]  York Ram E 06/24/2017, 1:46 PM

## 2017-06-24 NOTE — Progress Notes (Signed)
   Subjective: 2 Days Post-Op Procedure(s) (LRB): RIGHT TOTAL KNEE ARTHROPLASTY WITH COMPUTR NAVIGATION (Right) Patient reports pain as mild.   Plan is to go Home after hospital stay.  Objective: Vital signs in last 24 hours: Temp:  [97.7 F (36.5 C)-98.5 F (36.9 C)] 98.5 F (36.9 C) (03/23 0355) Pulse Rate:  [64-74] 64 (03/23 0355) BP: (140-155)/(66-77) 140/66 (03/23 0355) SpO2:  [98 %-100 %] 98 % (03/23 0355)  Intake/Output from previous day:  Intake/Output Summary (Last 24 hours) at 06/24/2017 0802 Last data filed at 06/24/2017 0358 Gross per 24 hour  Intake 1000 ml  Output 600 ml  Net 400 ml    Intake/Output this shift: No intake/output data recorded.  Labs: Recent Labs    06/22/17 0726 06/23/17 0545 06/24/17 0519  HGB 12.2 9.4* 10.2*   Recent Labs    06/23/17 0545 06/24/17 0519  WBC 7.4 6.7  RBC 3.12* 3.40*  HCT 28.4* 31.1*  PLT 132* 159   Recent Labs    06/22/17 0726 06/23/17 0545  NA 142 140  K 3.7 3.1*  CL  --  109  CO2  --  23  BUN  --  15  CREATININE  --  0.54  GLUCOSE 102* 112*  CALCIUM  --  8.0*   No results for input(s): LABPT, INR in the last 72 hours.  EXAM General - Patient is Alert, Appropriate and Oriented Extremity - Neurologically intact Neurovascular intact No cellulitis present Compartment soft Dressing/Incision - clean, dry, no drainage Motor Function - intact, moving foot and toes well on exam.   Past Medical History:  Diagnosis Date  . Arthritis   . Breast cancer ([place]) 05/08/12   invasive mammary ca, ER/PR+, HER 2 -  . GERD (gastroesophageal reflux disease)   . Hypertension   . PONV (postoperative nausea and vomiting)   . S/P radiation therapy 4-12 wks ago 08/06/12-09/24/12   left breast  . Wears glasses     Assessment/Plan: 2 Days Post-Op Procedure(s) (LRB): RIGHT TOTAL KNEE ARTHROPLASTY WITH COMPUTR NAVIGATION (Right) Principal Problem:   Osteoarthritis of right knee   Up with therapy Discharge home with  home health  DVT Prophylaxis - Aspirin Weight-Bearing as tolerated to right leg  Gaynelle Arabian 06/24/2017, 8:02 AM

## 2017-06-24 NOTE — Progress Notes (Signed)
Physical Therapy Treatment Patient Details Name: Jill Austin MRN: [id number] DOB: Jul 25, 1945 Today's Date: 06/24/2017    History of Present Illness Pt is a 72 year old female s/p R TKA with hx of breast cancer    PT Comments    Pt reports feeling better and ambulated again in hallway.  Pt without dizziness with mobility this afternoon.  Pt also performed LE exercises.  Pt anticipates d/c home tomorrow.   Follow Up Recommendations  Follow surgeon's recommendation for DC plan and follow-up therapies     Equipment Recommendations  None recommended by PT    Recommendations for Other Services       Precautions / Restrictions Precautions Precautions: Fall;Knee Restrictions Other Position/Activity Restrictions: WBAT    Mobility  Bed Mobility Overal bed mobility: Needs Assistance Bed Mobility: Supine to Sit;Sit to Supine     Supine to sit: Min assist Sit to supine: Min guard   General bed mobility comments: assist for R LE over EOB   Transfers Overall transfer level: Needs assistance Equipment used: Rolling walker (2 wheeled) Transfers: Sit to/from Stand Sit to Stand: Min guard         General transfer comment: verbal cues for UE and LE positioning  Ambulation/Gait Ambulation/Gait assistance: Min guard Ambulation Distance (Feet): 80 Feet Assistive device: Rolling walker (2 wheeled) Gait Pattern/deviations: Step-to pattern;Decreased stance time - right;Antalgic;Step-through pattern     General Gait Details: verbal cues for RW positioning, step length, progressing to step through pattern       Wheelchair Mobility    Modified Rankin (Stroke Patients Only)       Balance                                            Cognition Arousal/Alertness: Awake/alert Behavior During Therapy: WFL for tasks assessed/performed Overall Cognitive Status: Within Functional Limits for tasks assessed                                         Exercises Total Joint Exercises Ankle Circles/Pumps: AROM;10 reps;Both Quad Sets: AROM;10 reps;Right Short Arc Quad: 10 reps;Right;AROM Heel Slides: AAROM;10 reps;Right Hip ABduction/ADduction: AROM;10 reps;Right Straight Leg Raises: AAROM;10 reps;Right Goniometric ROM: approximately 35* AAROM knee flexion, continues to be limited by pain    General Comments        Pertinent Vitals/Pain Pain Assessment: 0-10 Pain Score: 5  Pain Location: R knee Pain Descriptors / Indicators: Aching;Sore Pain Intervention(s): Repositioned;Limited activity within patient's tolerance;Monitored during session    Home Living                      Prior Function            PT Goals (current goals can now be found in the care plan section) Progress towards PT goals: Progressing toward goals    Frequency    7X/week      PT Plan Current plan remains appropriate    Co-evaluation              AM-PAC PT "6 Clicks" Daily Activity  Outcome Measure  Difficulty turning over in bed (including adjusting bedclothes, sheets and blankets)?: A Little Difficulty moving from lying on back to sitting on the side of the bed? : A Little Difficulty sitting  down on and standing up from a chair with arms (e.g., wheelchair, bedside commode, etc,.)?: A Little Help needed moving to and from a bed to chair (including a wheelchair)?: A Little Help needed walking in hospital room?: A Little Help needed climbing 3-5 steps with a railing? : A Little 6 Click Score: 18    End of Session Equipment Utilized During Treatment: Gait belt Activity Tolerance: Patient tolerated treatment well Patient left: in bed;with call bell/phone within reach;with family/visitor present Nurse Communication: Mobility status PT Visit Diagnosis: Other abnormalities of gait and mobility (R26.89);Pain Pain - Right/Left: Right Pain - part of body: Knee     Time: 7121-9758 PT Time Calculation (min) (ACUTE ONLY): 18  min  Charges:   $Therapeutic Exercise: 8-22 mins                    G Codes:      Carmelia Bake, PT, DPT 06/24/2017 Pager: [phone] York Ram E 06/24/2017, 4:18 PM

## 2017-06-24 NOTE — Progress Notes (Signed)
PT informed RN re pt's BP while PT working with her. Notified PA Jennette Kettle & orders given. Travonne Schowalter, [employer]

## 2017-06-24 NOTE — Care Management Note (Signed)
Case Management Note  Patient Details  Name: Jill Austin MRN: [id number] Date of Birth: [birth year]  Subjective/Objective:    S/p Right TKA                Action/Plan:  NCM spoke to pt and she has RW at home. Pt states she wants HH PT. She is preoperatively arranged by surgeon's office. Pt agreeable to [facility] for HHPT.    Expected Discharge Date:  06/24/17               Expected Discharge Plan:  [place]  In-House Referral:  NA  Discharge planning Services  CM Consult  Post Acute Care Choice:  Home Health Choice offered to:  Patient  DME Arranged:  N/A DME Agency:  NA  HH Arranged:  PT [place] Agency:  [facility] (formerly [employer])  Status of Service:  Completed, signed off  If discussed at [employer], dates discussed:    Additional Comments:  Erenest Rasher, RN 06/24/2017, 3:16 PM

## 2017-06-25 LAB — CBC
HCT: 32.9 % — ABNORMAL LOW (ref 36.0–46.0)
Hemoglobin: 10.4 g/dL — ABNORMAL LOW (ref 12.0–15.0)
MCH: 29.5 pg (ref 26.0–34.0)
MCHC: 31.6 g/dL (ref 30.0–36.0)
MCV: 93.2 fL (ref 78.0–100.0)
PLATELETS: 192 10*3/uL (ref 150–400)
RBC: 3.53 MIL/uL — AB (ref 3.87–5.11)
RDW: 14 % (ref 11.5–15.5)
WBC: 6.9 10*3/uL (ref 4.0–10.5)

## 2017-06-25 NOTE — Progress Notes (Signed)
Physical Therapy Treatment Patient Details Name: Jill Austin MRN: [id number] DOB: 08-31-1945 Today's Date: 06/25/2017    History of Present Illness Pt is a 72 year old female s/p R TKA with hx of breast cancer    PT Comments    Pt ambulated in hallway and practiced steps again with daughter assisting this morning.  Pt reports understanding and feels ready for d/c home today.  Pt without any dizziness during mobility.  Pt agreeable to perform exercises once settled at home, has HEP handout.   Follow Up Recommendations  Follow surgeon's recommendation for DC plan and follow-up therapies     Equipment Recommendations  None recommended by PT    Recommendations for Other Services       Precautions / Restrictions Precautions Precautions: Fall;Knee Restrictions Weight Bearing Restrictions: No Other Position/Activity Restrictions: WBAT    Mobility  Bed Mobility Overal bed mobility: Needs Assistance Bed Mobility: Supine to Sit;Sit to Supine     Supine to sit: Min guard Sit to supine: Min guard   General bed mobility comments: verbal cues for self assist  Transfers Overall transfer level: Needs assistance Equipment used: Rolling walker (2 wheeled) Transfers: Sit to/from Stand Sit to Stand: Min guard         General transfer comment: verbal cues for UE and LE positioning  Ambulation/Gait Ambulation/Gait assistance: Min guard Ambulation Distance (Feet): 140 Feet Assistive device: Rolling walker (2 wheeled) Gait Pattern/deviations: Step-to pattern;Decreased stance time - right;Antalgic;Step-through pattern     General Gait Details: verbal cues for RW positioning, step length, progressing to step through pattern   Stairs Stairs: Yes   Stair Management: Step to pattern;Backwards;With walker Number of Stairs: 2 General stair comments: verbal cues for sequence, RW positioning, safety, daughter present and assisted with RW  Wheelchair Mobility    Modified  Rankin (Stroke Patients Only)       Balance                                            Cognition Arousal/Alertness: Awake/alert Behavior During Therapy: WFL for tasks assessed/performed Overall Cognitive Status: Within Functional Limits for tasks assessed                                        Exercises      General Comments        Pertinent Vitals/Pain Pain Assessment: 0-10 Pain Score: 5  Pain Location: R knee Pain Descriptors / Indicators: Aching;Sore Pain Intervention(s): Limited activity within patient's tolerance;Repositioned;Monitored during session;Ice applied    Home Living                      Prior Function            PT Goals (current goals can now be found in the care plan section) Progress towards PT goals: Progressing toward goals    Frequency    7X/week      PT Plan Current plan remains appropriate    Co-evaluation              AM-PAC PT "6 Clicks" Daily Activity  Outcome Measure  Difficulty turning over in bed (including adjusting bedclothes, sheets and blankets)?: A Little Difficulty moving from lying on back to sitting on the side of the  bed? : A Little Difficulty sitting down on and standing up from a chair with arms (e.g., wheelchair, bedside commode, etc,.)?: A Little Help needed moving to and from a bed to chair (including a wheelchair)?: A Little Help needed walking in hospital room?: A Little Help needed climbing 3-5 steps with a railing? : A Little 6 Click Score: 18    End of Session   Activity Tolerance: Patient tolerated treatment well Patient left: in bed;with call bell/phone within reach;with family/visitor present Nurse Communication: Mobility status PT Visit Diagnosis: Other abnormalities of gait and mobility (R26.89);Pain Pain - Right/Left: Right Pain - part of body: Knee     Time: 7846-9629 PT Time Calculation (min) (ACUTE ONLY): 23 min  Charges:  $Gait  Training: 8-22 mins                    G Codes:       Carmelia Bake, PT, DPT 06/25/2017 Pager: [phone]   York Ram E 06/25/2017, 12:23 PM

## 2017-06-25 NOTE — Progress Notes (Signed)
Discharged from floor via w/c for transport home by car. Belongings & family with pt. No changes in assessment. Jill Austin  

## 2017-06-25 NOTE — Care Management Important Message (Signed)
Important Message  Patient Details  Name: THEONE BOWELL MRN: [id number] Date of Birth: 1945-04-30   Medicare Important Message Given:  Yes    Erenest Rasher, RN 06/25/2017, 12:55 PM

## 2017-06-25 NOTE — Progress Notes (Signed)
Jill Austin  MRN: [id number] DOB/Age: [birth year] 72 y.o. [name] Procedure: Procedure(s) (LRB): RIGHT TOTAL KNEE ARTHROPLASTY WITH COMPUTR NAVIGATION (Right)     Subjective: Feeling better today, was kept because of hypotension  Vital Signs Temp:  [98.5 F (36.9 C)-99.8 F (37.7 C)] 99.8 F (37.7 C) (03/24 0418) Pulse Rate:  [67-85] 83 (03/24 0418) Resp:  [16-19] 16 (03/24 0418) BP: (78-140)/(46-68) 140/68 (03/24 0418) SpO2:  [92 %-100 %] 92 % (03/24 0418)  Lab Results Recent Labs    06/24/17 0519 06/25/17 0431  WBC 6.7 6.9  HGB 10.2* 10.4*  HCT 31.1* 32.9*  PLT 159 192   BMET Recent Labs    06/23/17 0545  NA 140  K 3.1*  CL 109  CO2 23  GLUCOSE 112*  BUN 15  CREATININE 0.54  CALCIUM 8.0*   No results found for: INR   Exam Knee dressing dry nvi        Plan Dc home today after PT  Jenetta Loges PA-C  06/25/2017, 9:16 AM Contact # [phone]

## 2018-02-02 NOTE — Progress Notes (Signed)
ID: Jill Austin   DOB: 1945/11/14  MR#: [id number]  JJO#:[id number]  PCP: Maurice Small, MD GYN: Elyse Hsu SU: Osborn Coho OTHER MD: Thea Silversmith, Johnnette Gourd, Luberta Mutter, Lorrene Reid  CHIEF COMPLAINT:  Left Breast Cancer  CURRENT TREATMENT: Completing 5 years of anastrozole  BREAST CANCER HISTORY: From the original intake note:  Jill Austin had routine screening mammography at [facility] 05/05/2011, showing heterogeneously dense breasts. There were no findings of concern. On 05/07/2012, a new spiculated mass was noted in the left breast, and the patient was recalled for left breast ultrasound 05/08/2012. This confirmed a hypoechoic mass measuring 1.3 cm, at the 11:00 position 11 cm from the nipple. The left axilla was unremarkable.  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  The patient's subsequent history is as detailed below.  INTERVAL HISTORY: Jill Austin returns today for follow-up and treatment of her estrogen receptor positive breast cancer. She is here alone.  She is tolerating anastrozole well.  Specifically hot flashes and vaginal dryness or not a major issue for her.  REVIEW OF SYSTEMS:  She noticed weight gain and states that she had a knee replacement a few months ago with Dr. Lyla Glassing. She states that she is taking care of her husband, who recently had a hip replacement that required him to have a catheter placed, and is therefore not able to exercise. She denies unusual headaches, visual changes, nausea, vomiting, or dizziness. There has been no unusual cough, phlegm production, or pleurisy. This been no change in bowel or bladder habits. She denies unexplained fatigue or unexplained weight loss, bleeding, rash, or fever. A detailed review of systems was otherwise stable.     PAST MEDICAL HISTORY: Past Medical History:  Diagnosis Date  . Arthritis   . Breast cancer ([place]) 05/08/12   invasive mammary ca, ER/PR+, HER 2 -  . GERD (gastroesophageal reflux disease)   . Hypertension   . PONV (postoperative nausea and vomiting)   . S/P radiation therapy 4-12 wks ago 08/06/12-09/24/12   left breast  . Wears glasses    tinnitus and hearing loss left year; history of remote panic attacks; GERD; wrinkle over the left retina  PAST SURGICAL HISTORY: Past Surgical History:  Procedure Laterality Date  . BREAST LUMPECTOMY WITH NEEDLE LOCALIZATION AND AXILLARY SENTINEL LYMPH NODE BX Left 06/05/2012   Procedure: BREAST LUMPECTOMY WITH NEEDLE LOCALIZATION AND AXILLARY SENTINEL LYMPH NODE BX;  Surgeon: Haywood Lasso, MD;  Location: [facility];  Service: General;  Laterality: Left;  . COLONOSCOPY    . DILATION AND CURETTAGE OF UTERUS    . EYE SURGERY     left cataract with lens implant  . EYE SURGERY     left eye peel   . HAND OSTEOPLASTY  2007   left [facility]  . KNEE ARTHROPLASTY Right 06/22/2017   Procedure: RIGHT TOTAL KNEE ARTHROPLASTY WITH COMPUTR NAVIGATION;  Surgeon: Rod Can, MD;  Location: [facility];  Service: Orthopedics;  Laterality: Right;  Needs RNFA  . POLYPECTOMY     uterine  Hand surgery under Ardine Bjork Removal of uterine polyp  FAMILY HISTORY No family history on file. The patient's father died at the age of 11 from heart problems. The patient's mother died at the age of 56, from heart problems. [name] had one brother, no sisters. There is no  history of breast or ovarian cancer in the family to her knowledge.  GYNECOLOGIC HISTORY: Menarche age 34, first live birth age 36, she is [place] P2, last menstrual period approximately 1998. She took hormone replacement for approximately 7 years.  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 Her husband Jill Austin (goes by [employer] is retired from [employer]. He is  having significant spine problems. Their daughter Jill Austin teaches 48-year-olds in [place] in [place]; daughter Jill Austin lives in [place] and is a homemaker. The patient has 4 grandchildren. She attends [employer] [place]: In place  HEALTH MAINTENANCE: (Updated 03/13/2013) Social History   Tobacco Use  . Smoking status: Never Smoker  . Smokeless tobacco: Never Used  Substance Use Topics  . Alcohol use: Yes    Alcohol/week: 10.0 standard drinks    Types: 10 Glasses of wine per week  . Drug use: No    Colonoscopy: 2014, Buccini  PAP: 2011  Bone density: 06/07/2016 SOLIS: osteopenia, T - 2.1  Lipid panel: Dr. Jonny Ruiz    Allergies  Allergen Reactions  . Penicillins Other (See Comments)    headache Has patient had a PCN reaction causing immediate rash, facial/tongue/throat swelling, SOB or lightheadedness with hypotension: No Has patient had a PCN reaction causing severe rash involving mucus membranes or skin necrosis: No Has patient had a PCN reaction that required hospitalization: No Has patient had a PCN reaction occurring within the last 10 years: No If all of the above answers are "NO", then may proceed with Cephalosporin use.   . Vicodin [Hydrocodone-Acetaminophen] Other (See Comments)    Hyper   . Erythromycin Base Dermatitis    Current Outpatient Medications  Medication Sig Dispense Refill  . acetaminophen (TYLENOL) 325 MG tablet Take 1-2 tablets (325-650 mg total) by mouth every 6 (six) hours as needed for mild pain (pain score 1-3 or temp > 100.5).    Marland Kitchen ALPRAZolam (XANAX) 0.5 MG tablet Take 0.5 mg by mouth daily as needed for anxiety or sleep.    Marland Kitchen amLODipine (NORVASC) 5 MG tablet Take 5 mg by mouth daily.     Marland Kitchen amphetamine-dextroamphetamine (ADDERALL) 30 MG tablet Take 30 mg by mouth daily.    Marland Kitchen anastrozole (ARIMIDEX) 1 MG tablet TAKE 1 TABLET BY MOUTH  DAILY 90 tablet 4  .  aspirin 81 MG chewable tablet Chew 1 tablet (81 mg total) by mouth 2 (two) times daily. 60 tablet 1  . calcium carbonate (TUMS - DOSED IN MG ELEMENTAL CALCIUM) 500 MG chewable tablet Chew 1-2 tablets by mouth daily as needed for indigestion or heartburn.    . Calcium Carbonate-Vitamin D (CALCIUM 600 + D PO) Take 600 mg by mouth daily.    . Carboxymethylcellul-Glycerin (LUBRICATING EYE DROPS OP) Place 1 drop into both eyes daily as needed (dry eyes).    . diphenhydrAMINE (BENADRYL) 25 MG tablet Take 50 mg by mouth daily as needed for allergies.    Marland Kitchen docusate sodium (COLACE) 100 MG capsule Take 1 capsule (100 mg total) by mouth 2 (two) times daily. 60 capsule 1  . fish oil-omega-3 fatty acids 1000 MG capsule Take 1 g by mouth daily.     . hydrochlorothiazide (MICROZIDE) 12.5 MG capsule Take 12.5 mg by mouth daily.    Marland Kitchen HYDROmorphone (DILAUDID) 2 MG tablet Take 0.5-1 tablets (1-2 mg total) by mouth every 4 (four) hours as needed for severe pain. 60 tablet 0  .  loratadine (CLARITIN) 10 MG tablet Take 10 mg by mouth daily.    . meloxicam (MOBIC) 15 MG tablet Take 15 mg by mouth daily.     . Multiple Vitamin (MULTIVITAMIN) capsule Take 1 capsule by mouth daily.    . Multiple Vitamins-Minerals (OCUVITE PO) Take 1 tablet by mouth daily.    Marland Kitchen omeprazole (PRILOSEC) 20 MG capsule Take 20 mg by mouth daily after breakfast.    . ondansetron (ZOFRAN) 4 MG tablet Take 1 tablet (4 mg total) by mouth every 6 (six) hours as needed for nausea. 20 tablet 0  . oxymetazoline (AFRIN) 0.05 % nasal spray Place 1 spray into both nostrils daily as needed for congestion.    . pravastatin (PRAVACHOL) 20 MG tablet Take 20 mg by mouth daily.    . pseudoephedrine-acetaminophen (TYLENOL SINUS) 30-500 MG TABS tablet Take 1 tablet by mouth daily as needed (sinuses).    . senna (SENOKOT) 8.6 MG TABS tablet Take 2 tablets (17.2 mg total) by mouth at bedtime. 120 each 0  . sertraline (ZOLOFT) 100 MG tablet Take 100 mg by mouth daily.      . Simethicone (GAS-X PO) Take 1-2 tablets by mouth daily as needed (gas).     No current facility-administered medications for this visit.     OBJECTIVE: Middle-aged white woman in no acute distress   Vitals:   02/06/18 1411  BP: (!) 167/98  Pulse: 77  Resp: 18  Temp: 98.4 F (36.9 C)  SpO2: 97%     Body mass index is 27.78 kg/m.    ECOG FS: 0 Filed Weights   02/06/18 1411  Weight: 164 lb 6.4 oz (74.6 kg)   Sclerae unicteric, pupils round and equal Oropharynx clear and moist No cervical or supraclavicular adenopathy Lungs no rales or rhonchi Heart regular rate and rhythm Abd soft, nontender, positive bowel sounds MSK no focal spinal tenderness, no upper extremity lymphedema Neuro: nonfocal, well oriented, appropriate affect Breasts: The right breast is unremarkable.  On the left she is status post lumpectomy and radiation.  There is no evidence of local recurrence.  Both axillae are benign.   LAB RESULTS: Lab Results  Component Value Date   WBC 5.9 02/06/2018   NEUTROABS 3.9 02/06/2018   HGB 13.3 02/06/2018   HCT 41.2 02/06/2018   MCV 90.2 02/06/2018   PLT 189 02/06/2018      Chemistry      Component Value Date/Time   NA 140 06/23/2017 0545   NA 139 02/06/2017 1345   K 3.1 (L) 06/23/2017 0545   K 3.7 02/06/2017 1345   CL 109 06/23/2017 0545   CL 103 05/16/2012 0849   CO2 23 06/23/2017 0545   CO2 27 02/06/2017 1345   BUN 15 06/23/2017 0545   BUN 15.2 02/06/2017 1345   CREATININE 0.54 06/23/2017 0545   CREATININE 0.8 02/06/2017 1345      Component Value Date/Time   CALCIUM 8.0 (L) 06/23/2017 0545   CALCIUM 9.2 02/06/2017 1345   ALKPHOS 68 02/06/2017 1345   AST 20 02/06/2017 1345   ALT 14 02/06/2017 1345   BILITOT 1.14 02/06/2017 1345       STUDIES: No results found.  ASSESSMENT: 72 y.o. [place] woman status post left upper inner quadrant lumpectomy and sentinel lymph node sampling 06/05/2012 for a mpT1c pN0, stage IA invasive ductal  carcinoma, grade 3, estrogen and progesterone receptor positive both at 100%, HER-2/neu negative, with an MIB-1 of 12%.  (1) Oncotype DX showed a score  of 14, predicting a distant recurrence risk of 9% within the next 10 years if the patient's only systemic treatment is tamoxifen for 5 years  (2) adjuvant radiation, completed 09/19/2012   (3) Started anastrozole July 2014, discontinued July 2016 with multiple side effects, resumed January 2017, completing 5 years November 2019  (4) osteopenia with a T score of -1.8 on bone scan April 2014  (a) repeat [facility]/29/2014, [facility] showed a T score of -1.9  PLAN:  Haiden is now 5-1/2 years out from definitive surgery for her breast cancer with no evidence of disease recurrence.  This is very favorable.  She is completing 5 years of antiestrogens.  She has tolerated those well.  By taking anastrozole all this time she has cut in half her risk of breast cancer recurrence and also her risk of a new breast cancer developing.  At this point I feel comfortable releasing her to her primary care physician.  All she will need in terms of breast cancer follow-up is a yearly mammography and a yearly physician breast exam  I will be glad to see Yarely again at any point in the future if and when the need arises but as of now are making no further routine appointments for her here.  Magrinat, Virgie Dad, MD  02/06/18 2:35 PM Medical Oncology and Hematology [facility] [address] [place], [place] [address] Tel. [phone]    Fax. [phone]  Dierdre Searles Dweik am acting as scribe for Dr. Virgie Dad Magrinat.  I, Lurline Del MD, have reviewed the above documentation for accuracy and completeness, and I agree with the above.

## 2018-02-06 ENCOUNTER — Telehealth: Payer: Self-pay | Admitting: Oncology

## 2018-02-06 ENCOUNTER — Inpatient Hospital Stay: Payer: Medicare Other | Attending: Oncology | Admitting: Oncology

## 2018-02-06 ENCOUNTER — Inpatient Hospital Stay: Payer: Medicare Other

## 2018-02-06 ENCOUNTER — Encounter: Payer: Self-pay | Admitting: Oncology

## 2018-02-06 VITALS — BP 167/98 | HR 77 | Temp 98.4°F | Resp 18 | Ht 64.5 in | Wt 164.4 lb

## 2018-02-06 DIAGNOSIS — Z7982 Long term (current) use of aspirin: Secondary | ICD-10-CM | POA: Diagnosis not present

## 2018-02-06 DIAGNOSIS — C50212 Malignant neoplasm of upper-inner quadrant of left female breast: Secondary | ICD-10-CM | POA: Diagnosis present

## 2018-02-06 DIAGNOSIS — Z170 Estrogen receptor positive status [ER+]: Secondary | ICD-10-CM

## 2018-02-06 DIAGNOSIS — I10 Essential (primary) hypertension: Secondary | ICD-10-CM | POA: Diagnosis not present

## 2018-02-06 DIAGNOSIS — Z923 Personal history of irradiation: Secondary | ICD-10-CM | POA: Diagnosis not present

## 2018-02-06 DIAGNOSIS — M8580 Other specified disorders of bone density and structure, unspecified site: Secondary | ICD-10-CM | POA: Insufficient documentation

## 2018-02-06 DIAGNOSIS — Z79899 Other long term (current) drug therapy: Secondary | ICD-10-CM | POA: Insufficient documentation

## 2018-02-06 DIAGNOSIS — Z79811 Long term (current) use of aromatase inhibitors: Secondary | ICD-10-CM | POA: Insufficient documentation

## 2018-02-06 LAB — CBC WITH DIFFERENTIAL/PLATELET
Abs Immature Granulocytes: 0.01 10*3/uL (ref 0.00–0.07)
BASOS ABS: 0 10*3/uL (ref 0.0–0.1)
Basophils Relative: 0 %
EOS PCT: 2 %
Eosinophils Absolute: 0.1 10*3/uL (ref 0.0–0.5)
HEMATOCRIT: 41.2 % (ref 36.0–46.0)
HEMOGLOBIN: 13.3 g/dL (ref 12.0–15.0)
Immature Granulocytes: 0 %
LYMPHS ABS: 1.5 10*3/uL (ref 0.7–4.0)
LYMPHS PCT: 25 %
MCH: 29.1 pg (ref 26.0–34.0)
MCHC: 32.3 g/dL (ref 30.0–36.0)
MCV: 90.2 fL (ref 80.0–100.0)
MONO ABS: 0.4 10*3/uL (ref 0.1–1.0)
MONOS PCT: 7 %
Neutro Abs: 3.9 10*3/uL (ref 1.7–7.7)
Neutrophils Relative %: 66 %
Platelets: 189 10*3/uL (ref 150–400)
RBC: 4.57 MIL/uL (ref 3.87–5.11)
RDW: 13.2 % (ref 11.5–15.5)
WBC: 5.9 10*3/uL (ref 4.0–10.5)
nRBC: 0 % (ref 0.0–0.2)

## 2018-02-06 LAB — COMPREHENSIVE METABOLIC PANEL
ALT: 13 U/L (ref 0–44)
AST: 18 U/L (ref 15–41)
Albumin: 4.1 g/dL (ref 3.5–5.0)
Alkaline Phosphatase: 77 U/L (ref 38–126)
Anion gap: 9 (ref 5–15)
BUN: 24 mg/dL — AB (ref 8–23)
CHLORIDE: 104 mmol/L (ref 98–111)
CO2: 27 mmol/L (ref 22–32)
CREATININE: 0.78 mg/dL (ref 0.44–1.00)
Calcium: 9.1 mg/dL (ref 8.9–10.3)
GFR calc Af Amer: >60 mL/min (ref >60)
GFR calc non Af Amer: >60 mL/min (ref >60)
GLUCOSE: 82 mg/dL (ref 70–99)
Potassium: 3.8 mmol/L (ref 3.5–5.1)
SODIUM: 140 mmol/L (ref 135–145)
Total Bilirubin: 1.1 mg/dL (ref 0.3–1.2)
Total Protein: 7.2 g/dL (ref 6.5–8.1)

## 2018-02-06 NOTE — Telephone Encounter (Signed)
No los per 11/5. °

## 2018-10-29 IMAGING — DX DG KNEE 1-2V PORT*R*
2 series · 2 of 2 positions shown · non-contrast
Comparison: None.

CLINICAL DATA: Status post right total knee replacement.

EXAM:
PORTABLE RIGHT KNEE - 1-2 VIEW

[knee ap]
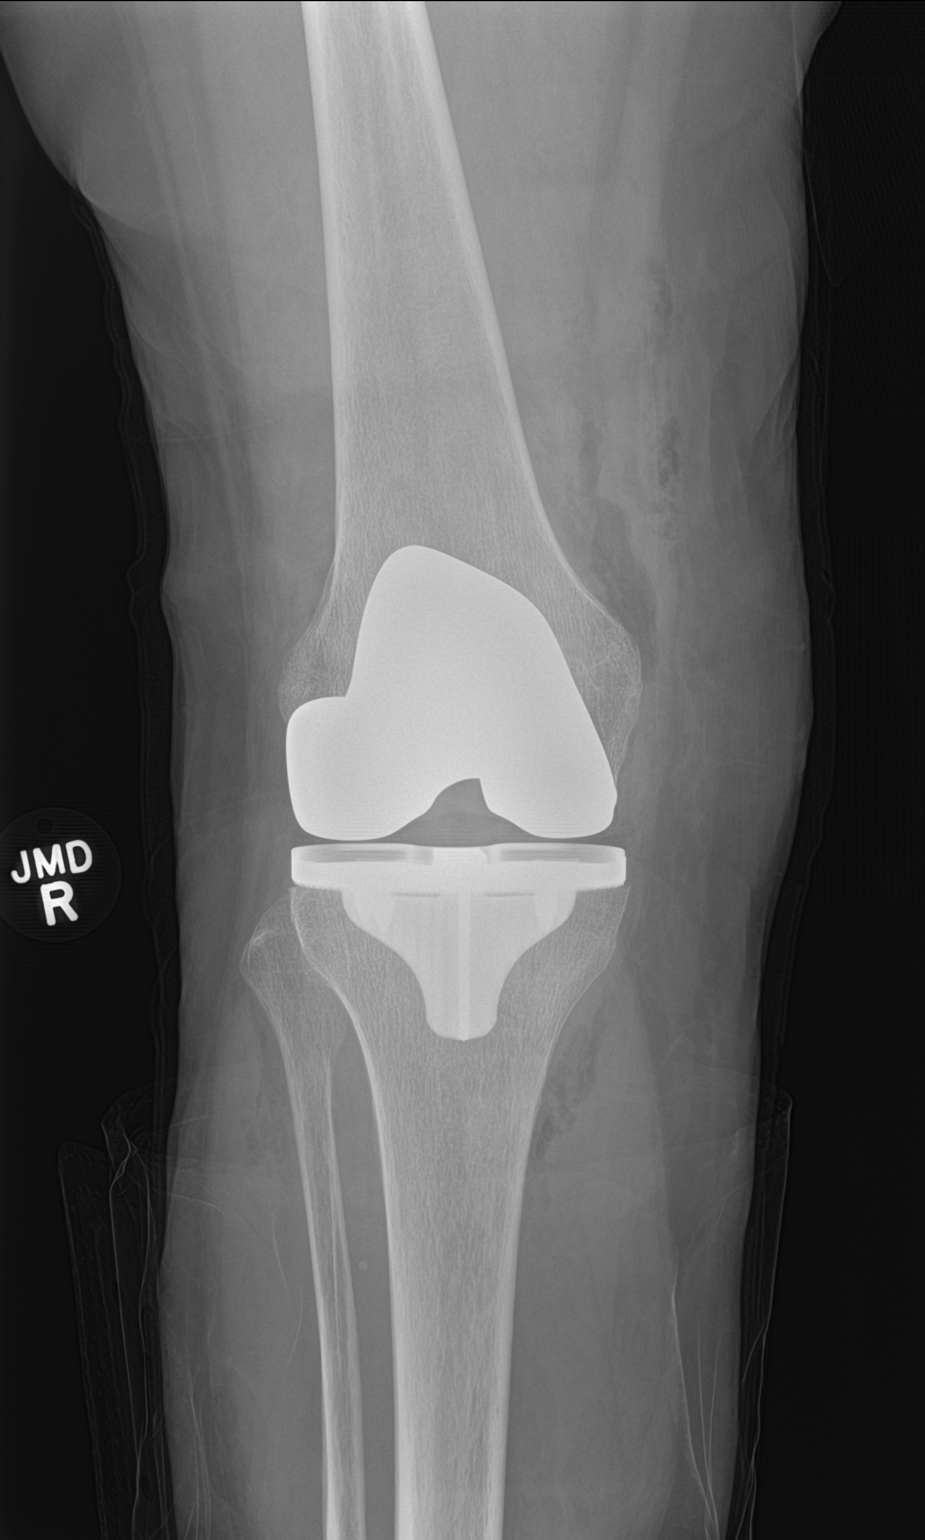

[knee lat]
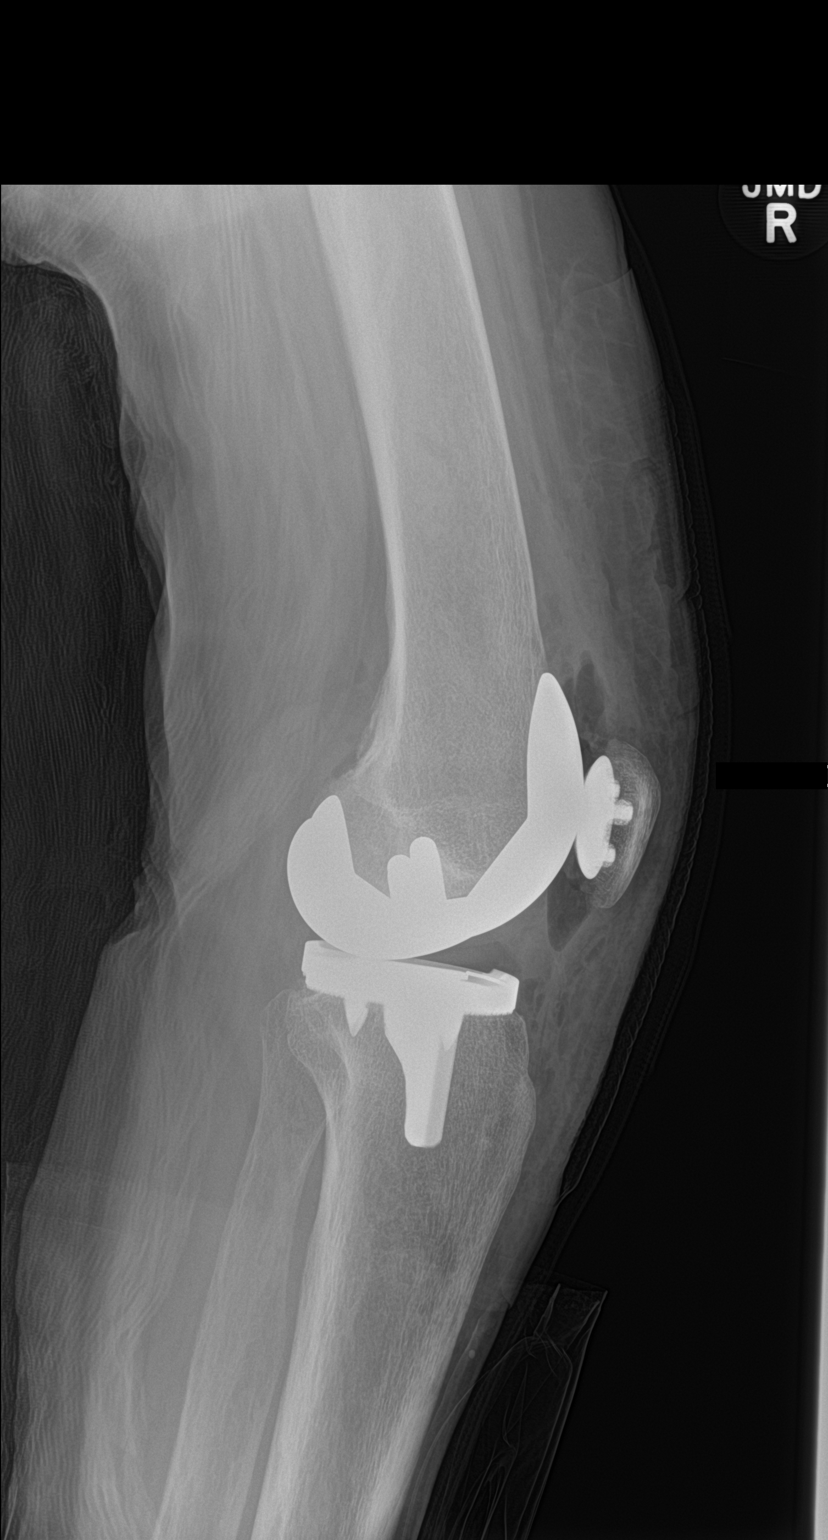

[2 of 2 positions shown; findings below may reference images not displayed]

FINDINGS: AP and lateral views of the right knee demonstrate a total knee
prosthesis in satisfactory position and alignment. Associated
postoperative air. No fracture or dislocation seen.
IMPRESSION: Satisfactory postoperative appearance of a right total knee
prosthesis.

## 2019-05-02 ENCOUNTER — Ambulatory Visit: Payer: Medicare Other

## 2019-05-07 ENCOUNTER — Ambulatory Visit: Payer: Medicare Other

## 2019-05-10 ENCOUNTER — Ambulatory Visit: Payer: Medicare Other | Attending: Internal Medicine

## 2019-05-10 DIAGNOSIS — Z23 Encounter for immunization: Secondary | ICD-10-CM

## 2019-06-04 ENCOUNTER — Ambulatory Visit: Payer: Medicare Other | Attending: Internal Medicine

## 2019-06-04 DIAGNOSIS — Z23 Encounter for immunization: Secondary | ICD-10-CM

## 2019-06-04 NOTE — Progress Notes (Signed)
   Covid-19 Vaccination Clinic  Name:  Jill Austin    MRN: [id number] DOB: 06/16/1945  06/04/2019  [name] was observed post Covid-19 immunization for 15 minutes without incident. She was provided with Vaccine Information Sheet and instruction to access the V-Safe system.   [name] was instructed to call 911 with any severe reactions post vaccine: Marland Kitchen Difficulty breathing  . Swelling of face and throat  . A fast heartbeat  . A bad rash all over body  . Dizziness and weakness   Immunizations Administered    Name Date Dose VIS Date Route   Pfizer COVID-19 Vaccine 06/04/2019  3:00 PM 0.3 mL 03/15/2019 Intramuscular   Manufacturer: [place]   Lot: [id number]   [place]: [id number]

## 2020-06-04 DIAGNOSIS — I10 Essential (primary) hypertension: Secondary | ICD-10-CM | POA: Diagnosis not present

## 2020-06-04 DIAGNOSIS — E785 Hyperlipidemia, unspecified: Secondary | ICD-10-CM | POA: Diagnosis not present

## 2020-06-04 DIAGNOSIS — M810 Age-related osteoporosis without current pathological fracture: Secondary | ICD-10-CM | POA: Diagnosis not present

## 2020-06-04 DIAGNOSIS — R7303 Prediabetes: Secondary | ICD-10-CM | POA: Diagnosis not present

## 2020-06-04 DIAGNOSIS — Z0000 Encounter for general adult medical examination without abnormal findings: Secondary | ICD-10-CM | POA: Diagnosis not present

## 2020-07-20 DIAGNOSIS — M810 Age-related osteoporosis without current pathological fracture: Secondary | ICD-10-CM | POA: Diagnosis not present

## 2020-07-20 DIAGNOSIS — Z853 Personal history of malignant neoplasm of breast: Secondary | ICD-10-CM | POA: Diagnosis not present

## 2020-07-20 DIAGNOSIS — N61 Mastitis without abscess: Secondary | ICD-10-CM | POA: Diagnosis not present

## 2020-07-20 DIAGNOSIS — M8589 Other specified disorders of bone density and structure, multiple sites: Secondary | ICD-10-CM | POA: Diagnosis not present

## 2020-08-14 DIAGNOSIS — D3132 Benign neoplasm of left choroid: Secondary | ICD-10-CM | POA: Diagnosis not present

## 2020-08-14 DIAGNOSIS — H52201 Unspecified astigmatism, right eye: Secondary | ICD-10-CM | POA: Diagnosis not present

## 2020-08-14 DIAGNOSIS — H2511 Age-related nuclear cataract, right eye: Secondary | ICD-10-CM | POA: Diagnosis not present

## 2021-03-17 DIAGNOSIS — U071 COVID-19: Secondary | ICD-10-CM | POA: Diagnosis not present

## 2021-04-20 DIAGNOSIS — I10 Essential (primary) hypertension: Secondary | ICD-10-CM | POA: Diagnosis not present

## 2021-04-20 DIAGNOSIS — N6323 Unspecified lump in the left breast, lower outer quadrant: Secondary | ICD-10-CM | POA: Diagnosis not present

## 2021-04-29 DIAGNOSIS — R928 Other abnormal and inconclusive findings on diagnostic imaging of breast: Secondary | ICD-10-CM | POA: Diagnosis not present

## 2021-04-29 DIAGNOSIS — N644 Mastodynia: Secondary | ICD-10-CM | POA: Diagnosis not present

## 2021-05-27 DIAGNOSIS — L821 Other seborrheic keratosis: Secondary | ICD-10-CM | POA: Diagnosis not present

## 2021-05-27 DIAGNOSIS — Z85828 Personal history of other malignant neoplasm of skin: Secondary | ICD-10-CM | POA: Diagnosis not present

## 2021-05-27 DIAGNOSIS — D225 Melanocytic nevi of trunk: Secondary | ICD-10-CM | POA: Diagnosis not present

## 2021-05-27 DIAGNOSIS — L814 Other melanin hyperpigmentation: Secondary | ICD-10-CM | POA: Diagnosis not present

## 2021-06-21 DIAGNOSIS — Z1231 Encounter for screening mammogram for malignant neoplasm of breast: Secondary | ICD-10-CM | POA: Diagnosis not present

## 2021-06-24 DIAGNOSIS — M5451 Vertebrogenic low back pain: Secondary | ICD-10-CM | POA: Diagnosis not present

## 2021-06-29 DIAGNOSIS — R7303 Prediabetes: Secondary | ICD-10-CM | POA: Diagnosis not present

## 2021-06-29 DIAGNOSIS — Z0000 Encounter for general adult medical examination without abnormal findings: Secondary | ICD-10-CM | POA: Diagnosis not present

## 2021-06-29 DIAGNOSIS — I10 Essential (primary) hypertension: Secondary | ICD-10-CM | POA: Diagnosis not present

## 2021-06-29 DIAGNOSIS — E785 Hyperlipidemia, unspecified: Secondary | ICD-10-CM | POA: Diagnosis not present

## 2021-07-07 DIAGNOSIS — M5451 Vertebrogenic low back pain: Secondary | ICD-10-CM | POA: Diagnosis not present

## 2021-07-07 DIAGNOSIS — M5459 Other low back pain: Secondary | ICD-10-CM | POA: Diagnosis not present

## 2021-07-19 DIAGNOSIS — M48061 Spinal stenosis, lumbar region without neurogenic claudication: Secondary | ICD-10-CM | POA: Diagnosis not present

## 2021-07-19 DIAGNOSIS — M5116 Intervertebral disc disorders with radiculopathy, lumbar region: Secondary | ICD-10-CM | POA: Diagnosis not present

## 2021-07-19 DIAGNOSIS — M4726 Other spondylosis with radiculopathy, lumbar region: Secondary | ICD-10-CM | POA: Diagnosis not present

## 2021-08-17 DIAGNOSIS — M5451 Vertebrogenic low back pain: Secondary | ICD-10-CM | POA: Diagnosis not present

## 2021-08-27 DIAGNOSIS — H5201 Hypermetropia, right eye: Secondary | ICD-10-CM | POA: Diagnosis not present

## 2021-08-27 DIAGNOSIS — H35372 Puckering of macula, left eye: Secondary | ICD-10-CM | POA: Diagnosis not present

## 2021-08-27 DIAGNOSIS — H2511 Age-related nuclear cataract, right eye: Secondary | ICD-10-CM | POA: Diagnosis not present

## 2021-09-02 DIAGNOSIS — M5416 Radiculopathy, lumbar region: Secondary | ICD-10-CM | POA: Diagnosis not present

## 2021-09-21 DIAGNOSIS — M5416 Radiculopathy, lumbar region: Secondary | ICD-10-CM | POA: Diagnosis not present

## 2021-09-28 DIAGNOSIS — M47816 Spondylosis without myelopathy or radiculopathy, lumbar region: Secondary | ICD-10-CM | POA: Diagnosis not present

## 2021-10-07 DIAGNOSIS — H2511 Age-related nuclear cataract, right eye: Secondary | ICD-10-CM | POA: Diagnosis not present

## 2021-10-07 DIAGNOSIS — H269 Unspecified cataract: Secondary | ICD-10-CM | POA: Diagnosis not present

## 2021-10-12 DIAGNOSIS — M5451 Vertebrogenic low back pain: Secondary | ICD-10-CM | POA: Diagnosis not present

## 2021-10-12 DIAGNOSIS — M5416 Radiculopathy, lumbar region: Secondary | ICD-10-CM | POA: Diagnosis not present

## 2021-10-18 DIAGNOSIS — M545 Low back pain, unspecified: Secondary | ICD-10-CM | POA: Diagnosis not present

## 2021-10-25 DIAGNOSIS — M545 Low back pain, unspecified: Secondary | ICD-10-CM | POA: Diagnosis not present

## 2021-11-11 DIAGNOSIS — M545 Low back pain, unspecified: Secondary | ICD-10-CM | POA: Diagnosis not present

## 2021-11-23 DIAGNOSIS — M545 Low back pain, unspecified: Secondary | ICD-10-CM | POA: Diagnosis not present

## 2021-12-14 DIAGNOSIS — M545 Low back pain, unspecified: Secondary | ICD-10-CM | POA: Diagnosis not present

## 2021-12-31 HISTORY — PX: EYE SURGERY: SHX253

## 2022-01-24 DIAGNOSIS — M4316 Spondylolisthesis, lumbar region: Secondary | ICD-10-CM | POA: Diagnosis not present

## 2022-02-11 ENCOUNTER — Other Ambulatory Visit: Payer: Self-pay | Admitting: Neurological Surgery

## 2022-03-01 NOTE — Progress Notes (Signed)
Surgical Instructions    Your procedure is scheduled on Monday December 4th.  Report to [facility] Main Entrance "A" at 5:30 A.M., then check in with the Admitting office.  Call this number if you have problems the morning of surgery:  [phone]   If you have any questions prior to your surgery date call [phone]: Open Monday-Friday 8am-4pm If you experience any cold or flu symptoms such as cough, fever, chills, shortness of breath, etc. between now and your scheduled surgery, please notify us at the above number     Remember:  Do not eat after midnight the night before your surgery  You may drink clear liquids until 4:30am the morning of your surgery.   Clear liquids allowed are: Water, Non-Citrus Juices (without pulp), Carbonated Beverages, Clear Tea, Black Coffee ONLY (NO MILK, CREAM OR POWDERED CREAMER of any kind), and Gatorade    Take these medicines the morning of surgery with A SIP OF WATER: amLODipine (NORVASC) 5 MG tablet  loratadine (CLARITIN) 10 MG tablet  omeprazole (PRILOSEC) 20 MG capsule pravastatin (PRAVACHOL) 20 MG tablet  sertraline (ZOLOFT) 100 MG tablet    IF NEEDED  Carboxymethylcellul-Glycerin (LUBRICATING EYE DROPS OP)  diphenhydrAMINE (BENADRYL) 25 MG tablet  oxymetazoline (AFRIN) 0.05 % nasal spray    As of today, STOP taking any Aspirin (unless otherwise instructed by your surgeon) MOBIC, Aleve, Naproxen, Ibuprofen, Motrin, Advil, Goody's, BC's, all herbal medications, fish oil, and all vitamins.           Do not wear jewelry or makeup. Do not wear lotions, powders, perfumes or deodorant. Do not shave 48 hours prior to surgery.   Do not bring valuables to the hospital. Do not wear nail polish, gel polish, artificial nails, or any other type of covering on natural nails (fingers and toes) If you have artificial nails or gel coating that need to be removed by a nail salon, please have this removed prior to surgery. Artificial nails or gel  coating may interfere with anesthesia's ability to adequately monitor your vital signs.  [facility] is not responsible for any belongings or valuables.    Do NOT Smoke (Tobacco/Vaping)  24 hours prior to your procedure  If you use a CPAP at night, you may bring your mask for your overnight stay.   Contacts, glasses, hearing aids, dentures or partials may not be worn into surgery, please bring cases for these belongings   For patients admitted to the hospital, discharge time will be determined by your treatment team.   Patients discharged the day of surgery will not be allowed to drive home, and someone needs to stay with them for 24 hours.   SURGICAL WAITING ROOM VISITATION Patients having surgery or a procedure may have no more than 2 support people in the waiting area - these visitors may rotate.   Children under the age of 77 must have an adult with them who is not the patient. If the patient needs to stay at the hospital during part of their recovery, the visitor guidelines for inpatient rooms apply. Pre-op nurse will coordinate an appropriate time for 1 support person to accompany patient in pre-op.  This support person may not rotate.   Please refer to [email or web address] for the visitor guidelines for Inpatients (after your surgery is over and you are in a regular room).    Special instructions:    Oral Hygiene is also important to reduce your risk of infection.  Remember - BRUSH YOUR TEETH THE MORNING  OF SURGERY WITH YOUR REGULAR TOOTHPASTE   [facility]- Preparing For Surgery  Before surgery, you can play an important role. Because skin is not sterile, your skin needs to be as free of germs as possible. You can reduce the number of germs on your skin by washing with CHG (chlorahexidine gluconate) Soap before surgery.  CHG is an antiseptic cleaner which kills germs and bonds with the skin to continue killing germs even after  washing.     Please do not use if you have an allergy to CHG or antibacterial soaps. If your skin becomes reddened/irritated stop using the CHG.  Do not shave (including legs and underarms) for at least 48 hours prior to first CHG shower. It is OK to shave your face.  Please follow these instructions carefully.     Shower the NIGHT BEFORE SURGERY and the MORNING OF SURGERY with CHG Soap.   If you chose to wash your hair, wash your hair first as usual with your normal shampoo. After you shampoo, rinse your hair and body thoroughly to remove the shampoo.  Then [employer] and genitals (private parts) with your normal soap and rinse thoroughly to remove soap.  After that Use CHG Soap as you would any other liquid soap. You can apply CHG directly to the skin and wash gently with a scrungie or a clean washcloth.   Apply the CHG Soap to your body ONLY FROM THE NECK DOWN.  Do not use on open wounds or open sores. Avoid contact with your eyes, ears, mouth and genitals (private parts). Wash Face and genitals (private parts)  with your normal soap.   Wash thoroughly, paying special attention to the area where your surgery will be performed.  Thoroughly rinse your body with warm water from the neck down.  DO NOT shower/wash with your normal soap after using and rinsing off the CHG Soap.  Pat yourself dry with a CLEAN TOWEL.  Wear CLEAN PAJAMAS to bed the night before surgery  Place CLEAN SHEETS on your bed the night before your surgery  DO NOT SLEEP WITH PETS.   Day of Surgery:  Take a shower with CHG soap. Wear Clean/Comfortable clothing the morning of surgery Do not apply any deodorants/lotions.   Remember to brush your teeth WITH YOUR REGULAR TOOTHPASTE.    If you received a COVID test during your pre-op visit, it is requested that you wear a mask when out in public, stay away from anyone that may not be feeling well, and notify your surgeon if you develop symptoms. If you have been in  contact with anyone that has tested positive in the last 10 days, please notify your surgeon.    Please read over the following fact sheets that you were given.

## 2022-03-02 ENCOUNTER — Other Ambulatory Visit: Payer: Self-pay

## 2022-03-02 ENCOUNTER — Encounter (HOSPITAL_COMMUNITY)
Admission: RE | Admit: 2022-03-02 | Discharge: 2022-03-02 | Disposition: A | Discharge status: Home / Self Care | Payer: Medicare Other | Source: Ambulatory Visit | Attending: Neurological Surgery | Admitting: Neurological Surgery

## 2022-03-02 ENCOUNTER — Encounter (HOSPITAL_COMMUNITY): Payer: Self-pay

## 2022-03-02 VITALS — BP 180/92 | HR 77 | Temp 97.9°F | Resp 17 | Ht 64.0 in | Wt 164.4 lb

## 2022-03-02 DIAGNOSIS — Z01818 Encounter for other preprocedural examination: Secondary | ICD-10-CM

## 2022-03-02 DIAGNOSIS — I10 Essential (primary) hypertension: Secondary | ICD-10-CM

## 2022-03-02 LAB — SURGICAL PCR SCREEN

## 2022-03-02 LAB — CBC
HCT: 42.4 % (ref 36.0–46.0)
Hemoglobin: 13.5 g/dL (ref 12.0–15.0)
MCH: 29.2 pg (ref 26.0–34.0)
MCHC: 31.8 g/dL (ref 30.0–36.0)
MCV: 91.8 fL (ref 80.0–100.0)
Platelets: 185 10*3/uL (ref 150–400)
RBC: 4.62 MIL/uL (ref 3.87–5.11)
RDW: 13.2 % (ref 11.5–15.5)
WBC: 6.3 10*3/uL (ref 4.0–10.5)
nRBC: 0 % (ref 0.0–0.2)

## 2022-03-02 LAB — BASIC METABOLIC PANEL
Anion gap: 7 (ref 5–15)
BUN: 19 mg/dL (ref 8–23)
CO2: 28 mmol/L (ref 22–32)
Calcium: 8.9 mg/dL (ref 8.9–10.3)
Chloride: 103 mmol/L (ref 98–111)
Creatinine, Ser: 0.85 mg/dL (ref 0.44–1.00)
GFR, Estimated: >60 mL/min (ref >60)
Glucose, Bld: 97 mg/dL (ref 70–99)
Potassium: 3.5 mmol/L (ref 3.5–5.1)
Sodium: 138 mmol/L (ref 135–145)

## 2022-03-02 LAB — TYPE AND SCREEN
ABO/RH(D): O POS
Antibody Screen: NEGATIVE

## 2022-03-02 NOTE — Progress Notes (Signed)
PCP - Dr. Lindell Noe  Cardiologist - Denies  PPM/ICD - Denies  Chest x-ray - NI EKG - 03/02/22 Stress Test - Many years ago no F/U needed ECHO - Not sure Cardiac Cath - Denies  Sleep Study - Denies  DM - Denies  Blood Thinner Instructions:Denies Aspirin Instructions:Denies  ERAS Protcol -Yes  COVID TEST- NI   Anesthesia review: No  Patient denies shortness of breath, fever, cough and chest pain at PAT appointment   All instructions explained to the patient, with a verbal understanding of the material. Patient agrees to go over the instructions while at home for a better understanding.  The opportunity to ask questions was provided.

## 2022-03-02 NOTE — Progress Notes (Signed)
Recollect surgical PCR on day of surgery.

## 2022-03-06 NOTE — Anesthesia Preprocedure Evaluation (Signed)
Anesthesia Evaluation  Patient identified by MRN, date of birth, ID band Patient awake    Reviewed: Allergy & Precautions, NPO status , Patient's Chart, lab work & pertinent test results  History of Anesthesia Complications (+) PONV  Airway Mallampati: II  TM Distance: >3 FB Neck ROM: Full    Dental  (+) Dental Advisory Given   Pulmonary neg pulmonary ROS   breath sounds clear to auscultation       Cardiovascular hypertension, Pt. on medications (-) angina  Rhythm:Regular Rate:Normal     Neuro/Psych Chronic back pain    GI/Hepatic Neg liver ROS,GERD  Medicated and Controlled,,  Endo/Other  negative endocrine ROS    Renal/GU negative Renal ROS     Musculoskeletal   Abdominal   Peds  Hematology negative hematology ROS (+)   Anesthesia Other Findings H/o breast cancer  Reproductive/Obstetrics                              Anesthesia Physical Anesthesia Plan  ASA: 2  Anesthesia Plan: General   Post-op Pain Management: Tylenol PO (pre-op)*   Induction: Intravenous  PONV Risk Score and Plan: 4 or greater and Ondansetron, Dexamethasone, Midazolam and Treatment may vary due to age or medical condition  Airway Management Planned: Oral ETT  Additional Equipment: None  Intra-op Plan:   Post-operative Plan: Extubation in OR  Informed Consent:   Plan Discussed with:   Anesthesia Plan Comments:          Anesthesia Quick Evaluation

## 2022-03-07 ENCOUNTER — Ambulatory Visit (HOSPITAL_COMMUNITY): Payer: Medicare Other

## 2022-03-07 ENCOUNTER — Ambulatory Visit (HOSPITAL_COMMUNITY): Payer: Medicare Other | Admitting: Anesthesiology

## 2022-03-07 ENCOUNTER — Other Ambulatory Visit: Payer: Self-pay

## 2022-03-07 ENCOUNTER — Observation Stay (HOSPITAL_COMMUNITY)
Admission: RE | Admit: 2022-03-07 | Discharge: 2022-03-08 | Disposition: A | Discharge status: Home / Self Care | Payer: Medicare Other | Attending: Neurological Surgery | Admitting: Neurological Surgery

## 2022-03-07 ENCOUNTER — Encounter (HOSPITAL_COMMUNITY): Payer: Self-pay | Admitting: Neurological Surgery

## 2022-03-07 ENCOUNTER — Inpatient Hospital Stay (HOSPITAL_COMMUNITY)
Admission: RE | Disposition: A | Discharge status: Home / Self Care | Payer: Self-pay | Source: Home / Self Care | Attending: Neurological Surgery

## 2022-03-07 DIAGNOSIS — Z01818 Encounter for other preprocedural examination: Secondary | ICD-10-CM

## 2022-03-07 DIAGNOSIS — Z0389 Encounter for observation for other suspected diseases and conditions ruled out: Secondary | ICD-10-CM | POA: Diagnosis not present

## 2022-03-07 DIAGNOSIS — Z853 Personal history of malignant neoplasm of breast: Secondary | ICD-10-CM | POA: Diagnosis not present

## 2022-03-07 DIAGNOSIS — M4316 Spondylolisthesis, lumbar region: Secondary | ICD-10-CM

## 2022-03-07 DIAGNOSIS — M48062 Spinal stenosis, lumbar region with neurogenic claudication: Secondary | ICD-10-CM | POA: Diagnosis not present

## 2022-03-07 DIAGNOSIS — M5416 Radiculopathy, lumbar region: Secondary | ICD-10-CM | POA: Insufficient documentation

## 2022-03-07 DIAGNOSIS — Z96651 Presence of right artificial knee joint: Secondary | ICD-10-CM | POA: Diagnosis not present

## 2022-03-07 DIAGNOSIS — M4326 Fusion of spine, lumbar region: Secondary | ICD-10-CM | POA: Diagnosis not present

## 2022-03-07 DIAGNOSIS — Z79899 Other long term (current) drug therapy: Secondary | ICD-10-CM | POA: Insufficient documentation

## 2022-03-07 DIAGNOSIS — M48061 Spinal stenosis, lumbar region without neurogenic claudication: Secondary | ICD-10-CM | POA: Diagnosis not present

## 2022-03-07 LAB — SURGICAL PCR SCREEN
MRSA, PCR: NEGATIVE
Staphylococcus aureus: NEGATIVE

## 2022-03-07 SURGERY — POSTERIOR LUMBAR FUSION 1 LEVEL
Anesthesia: General | Site: Back

## 2022-03-07 MED ORDER — HYDROMORPHONE HCL 1 MG/ML IJ SOLN
INTRAMUSCULAR | Status: AC
  Filled 2022-03-07: qty 1

## 2022-03-07 MED ORDER — CHLORHEXIDINE GLUCONATE CLOTH 2 % EX PADS: 6.0000 | MEDICATED_PAD | Freq: Once | CUTANEOUS | Status: DC

## 2022-03-07 MED ORDER — LACTATED RINGERS IV SOLN: INTRAVENOUS | Status: DC

## 2022-03-07 MED ORDER — PHENYLEPHRINE 80 MCG/ML (10ML) SYRINGE FOR IV PUSH (FOR BLOOD PRESSURE SUPPORT)
PREFILLED_SYRINGE | INTRAVENOUS | Status: AC
  Filled 2022-03-07: qty 10

## 2022-03-07 MED ORDER — CHLORHEXIDINE GLUCONATE 0.12 % MT SOLN
15.0000 mL | Freq: Once | OROMUCOSAL | Status: AC
  Administered 2022-03-07: 15 mL via OROMUCOSAL
  Filled 2022-03-07: qty 15

## 2022-03-07 MED ORDER — HYDROMORPHONE HCL 1 MG/ML IJ SOLN
0.2500 mg | INTRAMUSCULAR | Status: DC | PRN
  Administered 2022-03-07 (×2): 0.5 mg via INTRAVENOUS

## 2022-03-07 MED ORDER — SENNA 8.6 MG PO TABS
1.0000 | ORAL_TABLET | Freq: Two times a day (BID) | ORAL | Status: DC
  Administered 2022-03-07 – 2022-03-08 (×2): 8.6 mg via ORAL
  Filled 2022-03-07 (×2): qty 1

## 2022-03-07 MED ORDER — THROMBIN (RECOMBINANT) 5000 UNITS EX SOLR
CUTANEOUS | Status: AC
  Filled 2022-03-07: qty 5000

## 2022-03-07 MED ORDER — MEPERIDINE HCL 25 MG/ML IJ SOLN: 6.2500 mg | INTRAMUSCULAR | Status: DC | PRN

## 2022-03-07 MED ORDER — VANCOMYCIN HCL IN DEXTROSE 1-5 GM/200ML-% IV SOLN
1000.0000 mg | INTRAVENOUS | Status: AC
  Administered 2022-03-07: 1000 mg via INTRAVENOUS
  Filled 2022-03-07: qty 200

## 2022-03-07 MED ORDER — BUPIVACAINE HCL (PF) 0.5 % IJ SOLN
INTRAMUSCULAR | Status: DC | PRN
  Administered 2022-03-07: 5 mL
  Administered 2022-03-07: 25 mL

## 2022-03-07 MED ORDER — LACTATED RINGERS IV SOLN: INTRAVENOUS | Status: DC | PRN

## 2022-03-07 MED ORDER — LIDOCAINE 2% (20 MG/ML) 5 ML SYRINGE
INTRAMUSCULAR | Status: DC | PRN
  Administered 2022-03-07: 30 mg via INTRAVENOUS

## 2022-03-07 MED ORDER — ACETAMINOPHEN 500 MG PO TABS
1000.0000 mg | ORAL_TABLET | Freq: Once | ORAL | Status: AC
  Administered 2022-03-07: 1000 mg via ORAL
  Filled 2022-03-07: qty 2

## 2022-03-07 MED ORDER — LIDOCAINE-EPINEPHRINE 1 %-1:100000 IJ SOLN
INTRAMUSCULAR | Status: DC | PRN
  Administered 2022-03-07: 5 mL

## 2022-03-07 MED ORDER — OXYMETAZOLINE HCL 0.05 % NA SOLN: 1.0000 | Freq: Every day | NASAL | Status: DC | PRN

## 2022-03-07 MED ORDER — SERTRALINE HCL 50 MG PO TABS
100.0000 mg | ORAL_TABLET | Freq: Every day | ORAL | Status: DC
  Administered 2022-03-08: 100 mg via ORAL
  Filled 2022-03-07: qty 2

## 2022-03-07 MED ORDER — THROMBIN 5000 UNITS EX SOLR
OROMUCOSAL | Status: DC | PRN
  Administered 2022-03-07 (×2): 5 mL via TOPICAL

## 2022-03-07 MED ORDER — BUPIVACAINE HCL (PF) 0.5 % IJ SOLN
INTRAMUSCULAR | Status: AC
  Filled 2022-03-07: qty 30

## 2022-03-07 MED ORDER — ROCURONIUM BROMIDE 10 MG/ML (PF) SYRINGE
PREFILLED_SYRINGE | INTRAVENOUS | Status: AC
  Filled 2022-03-07: qty 10

## 2022-03-07 MED ORDER — PROMETHAZINE HCL 25 MG/ML IJ SOLN: 6.2500 mg | INTRAMUSCULAR | Status: DC | PRN

## 2022-03-07 MED ORDER — DIPHENHYDRAMINE HCL 25 MG PO CAPS: 50.0000 mg | ORAL_CAPSULE | Freq: Every day | ORAL | Status: DC | PRN

## 2022-03-07 MED ORDER — LORATADINE 10 MG PO TABS
10.0000 mg | ORAL_TABLET | Freq: Every day | ORAL | Status: DC
  Administered 2022-03-08: 10 mg via ORAL
  Filled 2022-03-07: qty 1

## 2022-03-07 MED ORDER — MORPHINE SULFATE (PF) 2 MG/ML IV SOLN
2.0000 mg | INTRAVENOUS | Status: DC | PRN
  Administered 2022-03-07 (×2): 2 mg via INTRAVENOUS
  Filled 2022-03-07 (×2): qty 1

## 2022-03-07 MED ORDER — POLYETHYLENE GLYCOL 3350 17 G PO PACK: 17.0000 g | PACK | Freq: Every day | ORAL | Status: DC | PRN

## 2022-03-07 MED ORDER — ACETAMINOPHEN 325 MG PO TABS
650.0000 mg | ORAL_TABLET | ORAL | Status: DC | PRN
  Filled 2022-03-07: qty 2

## 2022-03-07 MED ORDER — ONDANSETRON HCL 4 MG PO TABS: 4.0000 mg | ORAL_TABLET | Freq: Four times a day (QID) | ORAL | Status: DC | PRN

## 2022-03-07 MED ORDER — MENTHOL 3 MG MT LOZG: 1.0000 | LOZENGE | OROMUCOSAL | Status: DC | PRN

## 2022-03-07 MED ORDER — PHENYLEPHRINE HCL (PRESSORS) 10 MG/ML IV SOLN
INTRAVENOUS | Status: DC | PRN
  Administered 2022-03-07 (×3): 80 ug via INTRAVENOUS

## 2022-03-07 MED ORDER — KETOROLAC TROMETHAMINE 15 MG/ML IJ SOLN
INTRAMUSCULAR | Status: AC
  Filled 2022-03-07: qty 1

## 2022-03-07 MED ORDER — CARBOXYMETHYLCELLUL-GLYCERIN 0.5-0.9 % OP SOLN: 1.0000 [drp] | OPHTHALMIC | Status: DC | PRN

## 2022-03-07 MED ORDER — OXYCODONE HCL 5 MG/5ML PO SOLN: 5.0000 mg | Freq: Once | ORAL | Status: DC | PRN

## 2022-03-07 MED ORDER — ALUM & MAG HYDROXIDE-SIMETH 200-200-20 MG/5ML PO SUSP: 30.0000 mL | Freq: Four times a day (QID) | ORAL | Status: DC | PRN

## 2022-03-07 MED ORDER — KETOROLAC TROMETHAMINE 15 MG/ML IJ SOLN
15.0000 mg | Freq: Once | INTRAMUSCULAR | Status: AC
  Administered 2022-03-07: 15 mg via INTRAVENOUS

## 2022-03-07 MED ORDER — FLEET ENEMA 7-19 GM/118ML RE ENEM: 1.0000 | ENEMA | Freq: Once | RECTAL | Status: DC | PRN

## 2022-03-07 MED ORDER — DEXAMETHASONE SODIUM PHOSPHATE 10 MG/ML IJ SOLN
INTRAMUSCULAR | Status: AC
  Filled 2022-03-07: qty 2

## 2022-03-07 MED ORDER — HYDROCHLOROTHIAZIDE 12.5 MG PO TABS
12.5000 mg | ORAL_TABLET | Freq: Every day | ORAL | Status: DC
  Administered 2022-03-08: 12.5 mg via ORAL
  Filled 2022-03-07 (×2): qty 1

## 2022-03-07 MED ORDER — SODIUM CHLORIDE 0.9% FLUSH
3.0000 mL | Freq: Two times a day (BID) | INTRAVENOUS | Status: DC
  Administered 2022-03-07: 3 mL via INTRAVENOUS

## 2022-03-07 MED ORDER — MIDAZOLAM HCL 2 MG/2ML IJ SOLN: 0.5000 mg | Freq: Once | INTRAMUSCULAR | Status: DC | PRN

## 2022-03-07 MED ORDER — SIMETHICONE 80 MG PO CHEW: 80.0000 mg | CHEWABLE_TABLET | Freq: Every day | ORAL | Status: DC | PRN

## 2022-03-07 MED ORDER — DEXAMETHASONE SODIUM PHOSPHATE 10 MG/ML IJ SOLN
INTRAMUSCULAR | Status: DC | PRN
  Administered 2022-03-07: 10 mg via INTRAVENOUS

## 2022-03-07 MED ORDER — PROPOFOL 10 MG/ML IV BOLUS
INTRAVENOUS | Status: AC
  Filled 2022-03-07: qty 20

## 2022-03-07 MED ORDER — LIDOCAINE 2% (20 MG/ML) 5 ML SYRINGE
INTRAMUSCULAR | Status: AC
  Filled 2022-03-07: qty 5

## 2022-03-07 MED ORDER — 0.9 % SODIUM CHLORIDE (POUR BTL) OPTIME
TOPICAL | Status: DC | PRN
  Administered 2022-03-07 (×2): 1000 mL

## 2022-03-07 MED ORDER — DEXTROSE IN LACTATED RINGERS 5 % IV SOLN: INTRAVENOUS | Status: DC

## 2022-03-07 MED ORDER — BISACODYL 10 MG RE SUPP: 10.0000 mg | Freq: Every day | RECTAL | Status: DC | PRN

## 2022-03-07 MED ORDER — PRAVASTATIN SODIUM 10 MG PO TABS
20.0000 mg | ORAL_TABLET | Freq: Every day | ORAL | Status: DC
  Administered 2022-03-08: 20 mg via ORAL
  Filled 2022-03-07: qty 2

## 2022-03-07 MED ORDER — AMLODIPINE BESYLATE 5 MG PO TABS
5.0000 mg | ORAL_TABLET | Freq: Every day | ORAL | Status: DC
  Administered 2022-03-08: 5 mg via ORAL
  Filled 2022-03-07: qty 1

## 2022-03-07 MED ORDER — OXYCODONE HCL 5 MG PO TABS: 5.0000 mg | ORAL_TABLET | Freq: Once | ORAL | Status: DC | PRN

## 2022-03-07 MED ORDER — PANTOPRAZOLE SODIUM 40 MG PO TBEC
40.0000 mg | DELAYED_RELEASE_TABLET | Freq: Every day | ORAL | Status: DC
  Administered 2022-03-08: 40 mg via ORAL
  Filled 2022-03-07: qty 1

## 2022-03-07 MED ORDER — SODIUM CHLORIDE 0.9 % IV SOLN
250.0000 mL | INTRAVENOUS | Status: DC
  Administered 2022-03-07: 250 mL via INTRAVENOUS

## 2022-03-07 MED ORDER — ACETAMINOPHEN 650 MG RE SUPP: 650.0000 mg | RECTAL | Status: DC | PRN

## 2022-03-07 MED ORDER — DOCUSATE SODIUM 100 MG PO CAPS
100.0000 mg | ORAL_CAPSULE | Freq: Two times a day (BID) | ORAL | Status: DC
  Administered 2022-03-07 – 2022-03-08 (×2): 100 mg via ORAL
  Filled 2022-03-07 (×2): qty 1

## 2022-03-07 MED ORDER — EPHEDRINE SULFATE (PRESSORS) 50 MG/ML IJ SOLN
INTRAMUSCULAR | Status: DC | PRN
  Administered 2022-03-07: 5 mg via INTRAVENOUS

## 2022-03-07 MED ORDER — PHENOL 1.4 % MT LIQD: 1.0000 | OROMUCOSAL | Status: DC | PRN

## 2022-03-07 MED ORDER — ONDANSETRON HCL 4 MG/2ML IJ SOLN
INTRAMUSCULAR | Status: DC | PRN
  Administered 2022-03-07: 4 mg via INTRAVENOUS

## 2022-03-07 MED ORDER — SUGAMMADEX SODIUM 200 MG/2ML IV SOLN
INTRAVENOUS | Status: DC | PRN
  Administered 2022-03-07: 150 mg via INTRAVENOUS

## 2022-03-07 MED ORDER — ORAL CARE MOUTH RINSE: 15.0000 mL | Freq: Once | OROMUCOSAL | Status: AC

## 2022-03-07 MED ORDER — THROMBIN 5000 UNITS EX SOLR
CUTANEOUS | Status: AC
  Filled 2022-03-07: qty 5000

## 2022-03-07 MED ORDER — VANCOMYCIN HCL IN DEXTROSE 1-5 GM/200ML-% IV SOLN
1000.0000 mg | Freq: Once | INTRAVENOUS | Status: AC
  Administered 2022-03-07: 1000 mg via INTRAVENOUS
  Filled 2022-03-07: qty 200

## 2022-03-07 MED ORDER — ONDANSETRON HCL 4 MG/2ML IJ SOLN
INTRAMUSCULAR | Status: AC
  Filled 2022-03-07: qty 2

## 2022-03-07 MED ORDER — FENTANYL CITRATE (PF) 250 MCG/5ML IJ SOLN
INTRAMUSCULAR | Status: DC | PRN
  Administered 2022-03-07: 250 ug via INTRAVENOUS

## 2022-03-07 MED ORDER — FENTANYL CITRATE (PF) 250 MCG/5ML IJ SOLN
INTRAMUSCULAR | Status: AC
  Filled 2022-03-07: qty 5

## 2022-03-07 MED ORDER — METHOCARBAMOL 1000 MG/10ML IJ SOLN: 500.0000 mg | Freq: Four times a day (QID) | INTRAVENOUS | Status: DC | PRN

## 2022-03-07 MED ORDER — LIDOCAINE-EPINEPHRINE 1 %-1:100000 IJ SOLN
INTRAMUSCULAR | Status: AC
  Filled 2022-03-07: qty 1

## 2022-03-07 MED ORDER — HYDROXYZINE HCL 50 MG/ML IM SOLN
50.0000 mg | Freq: Four times a day (QID) | INTRAMUSCULAR | Status: DC | PRN
  Administered 2022-03-07: 50 mg via INTRAMUSCULAR
  Filled 2022-03-07: qty 1

## 2022-03-07 MED ORDER — OXYCODONE-ACETAMINOPHEN 5-325 MG PO TABS
1.0000 | ORAL_TABLET | Freq: Four times a day (QID) | ORAL | Status: DC | PRN
  Administered 2022-03-07: 2 via ORAL
  Filled 2022-03-07: qty 2

## 2022-03-07 MED ORDER — PHENYLEPHRINE HCL-NACL 20-0.9 MG/250ML-% IV SOLN
INTRAVENOUS | Status: DC | PRN
  Administered 2022-03-07: 15 ug/min via INTRAVENOUS

## 2022-03-07 MED ORDER — OXYCODONE-ACETAMINOPHEN 5-325 MG PO TABS
1.0000 | ORAL_TABLET | ORAL | Status: DC | PRN
  Administered 2022-03-08 (×2): 2 via ORAL
  Administered 2022-03-08: 1 via ORAL
  Filled 2022-03-07 (×4): qty 2

## 2022-03-07 MED ORDER — METHOCARBAMOL 500 MG PO TABS
500.0000 mg | ORAL_TABLET | Freq: Four times a day (QID) | ORAL | Status: DC | PRN
  Administered 2022-03-07 – 2022-03-08 (×2): 500 mg via ORAL
  Filled 2022-03-07 (×2): qty 1

## 2022-03-07 MED ORDER — PROPOFOL 10 MG/ML IV BOLUS
INTRAVENOUS | Status: DC | PRN
  Administered 2022-03-07: 120 mg via INTRAVENOUS

## 2022-03-07 MED ORDER — ONDANSETRON HCL 4 MG/2ML IJ SOLN: 4.0000 mg | Freq: Four times a day (QID) | INTRAMUSCULAR | Status: DC | PRN

## 2022-03-07 MED ORDER — ROCURONIUM BROMIDE 10 MG/ML (PF) SYRINGE
PREFILLED_SYRINGE | INTRAVENOUS | Status: DC | PRN
  Administered 2022-03-07: 60 mg via INTRAVENOUS
  Administered 2022-03-07 (×5): 20 mg via INTRAVENOUS

## 2022-03-07 MED ORDER — SODIUM CHLORIDE 0.9% FLUSH: 3.0000 mL | INTRAVENOUS | Status: DC | PRN

## 2022-03-07 MED ORDER — TRAZODONE HCL 50 MG PO TABS: 50.0000 mg | ORAL_TABLET | Freq: Every day | ORAL | Status: DC

## 2022-03-07 SURGICAL SUPPLY — 69 items
BAG COUNTER SPONGE SURGICOUNT (BAG) ×1 IMPLANT
BASKET BONE COLLECTION (BASKET) ×1 IMPLANT
BLADE BONE MILL MEDIUM (MISCELLANEOUS) ×1 IMPLANT
BLADE CLIPPER SURG (BLADE) IMPLANT
BONE CANC CHIPS 20CC PCAN1/4 (Bone Implant) ×1 IMPLANT
BUR MATCHSTICK NEURO 3.0 LAGG (BURR) ×1 IMPLANT
CAGE PLIF 8X9X23-12 LUMBAR (Cage) IMPLANT
CANISTER SUCT 3000ML PPV (MISCELLANEOUS) ×1 IMPLANT
CEMENT KYPHON C01A KIT/MIXER (Cement) IMPLANT
CHIPS CANC BONE 20CC PCAN1/4 (Bone Implant) ×1 IMPLANT
CNTNR URN SCR LID CUP LEK RST (MISCELLANEOUS) ×1 IMPLANT
CONT SPEC 4OZ STRL OR WHT (MISCELLANEOUS) ×1
COVER BACK TABLE 60X90IN (DRAPES) ×1 IMPLANT
DERMABOND ADVANCED .7 DNX12 (GAUZE/BANDAGES/DRESSINGS) ×1 IMPLANT
DEVICE DISSECT PLASMABLAD 3.0S (MISCELLANEOUS) ×1 IMPLANT
DRAPE C-ARM 42X72 X-RAY (DRAPES) ×2 IMPLANT
DRAPE HALF SHEET 40X57 (DRAPES) IMPLANT
DRAPE LAPAROTOMY 100X72X124 (DRAPES) ×1 IMPLANT
DRSG OPSITE POSTOP 4X8 (GAUZE/BANDAGES/DRESSINGS) IMPLANT
DURAPREP 26ML APPLICATOR (WOUND CARE) ×1 IMPLANT
DURASEAL APPLICATOR TIP (TIP) IMPLANT
DURASEAL SPINE SEALANT 3ML (MISCELLANEOUS) IMPLANT
ELECT REM PT RETURN 9FT ADLT (ELECTROSURGICAL) ×1
ELECTRODE REM PT RTRN 9FT ADLT (ELECTROSURGICAL) ×1 IMPLANT
GAUZE 4X4 16PLY ~~LOC~~+RFID DBL (SPONGE) IMPLANT
GAUZE SPONGE 4X4 12PLY STRL (GAUZE/BANDAGES/DRESSINGS) ×1 IMPLANT
GLOVE BIOGEL PI IND STRL 8.5 (GLOVE) ×2 IMPLANT
GLOVE ECLIPSE 8.5 STRL (GLOVE) ×2 IMPLANT
GOWN STRL REUS W/ TWL LRG LVL3 (GOWN DISPOSABLE) IMPLANT
GOWN STRL REUS W/ TWL XL LVL3 (GOWN DISPOSABLE) IMPLANT
GOWN STRL REUS W/TWL 2XL LVL3 (GOWN DISPOSABLE) ×2 IMPLANT
GOWN STRL REUS W/TWL LRG LVL3 (GOWN DISPOSABLE)
GOWN STRL REUS W/TWL XL LVL3 (GOWN DISPOSABLE) ×2
GRAFT BNE CANC CHIPS 1-8 20CC (Bone Implant) IMPLANT
GRAFT BONE PROTEIOS LRG 5CC (Orthopedic Implant) IMPLANT
HEMOSTAT POWDER KIT SURGIFOAM (HEMOSTASIS) ×1 IMPLANT
KIT BASIN OR (CUSTOM PROCEDURE TRAY) ×1 IMPLANT
KIT GRAFTMAG DEL NEURO DISP (NEUROSURGERY SUPPLIES) IMPLANT
KIT TURNOVER KIT B (KITS) ×1 IMPLANT
MILL BONE PREP (MISCELLANEOUS) ×1 IMPLANT
NDL RELINE-OR FENS 16 (NEEDLE) IMPLANT
NEEDLE HYPO 22GX1.5 SAFETY (NEEDLE) ×1 IMPLANT
NEEDLE RELINE-OR FENS 16 (NEEDLE) ×5 IMPLANT
NS IRRIG 1000ML POUR BTL (IV SOLUTION) ×1 IMPLANT
PACK LAMINECTOMY NEURO (CUSTOM PROCEDURE TRAY) ×1 IMPLANT
PAD ARMBOARD 7.5X6 YLW CONV (MISCELLANEOUS) ×3 IMPLANT
PATTIES SURGICAL .5 X1 (DISPOSABLE) ×1 IMPLANT
PLASMABLADE 3.0S (MISCELLANEOUS) ×1
PUSHER RELINE FENS 36 OR (MISCELLANEOUS) IMPLANT
ROD RELINE-O LORD 5.5X40 (Rod) IMPLANT
SCREW LOCK RELINE 5.5 TULIP (Screw) IMPLANT
SCREW RELINE PA 6.5X45 (Screw) IMPLANT
SPIKE FLUID TRANSFER (MISCELLANEOUS) ×1 IMPLANT
SPONGE SURGIFOAM ABS GEL 100 (HEMOSTASIS) IMPLANT
SPONGE T-LAP 4X18 ~~LOC~~+RFID (SPONGE) IMPLANT
SUT PROLENE 6 0 BV (SUTURE) IMPLANT
SUT VIC AB 1 CT1 18XBRD ANBCTR (SUTURE) ×1 IMPLANT
SUT VIC AB 1 CT1 8-18 (SUTURE) ×1
SUT VIC AB 2-0 CP2 18 (SUTURE) ×1 IMPLANT
SUT VIC AB 3-0 SH 8-18 (SUTURE) ×1 IMPLANT
SUT VIC AB 4-0 RB1 18 (SUTURE) ×1 IMPLANT
SYR 3ML LL SCALE MARK (SYRINGE) ×4 IMPLANT
TIP CART INSTAFILL GL 5CC (ORTHOPEDIC DISPOSABLE SUPPLIES) IMPLANT
TIP FENS REPLACE RELINE (MISCELLANEOUS) IMPLANT
TOWEL GREEN STERILE (TOWEL DISPOSABLE) ×1 IMPLANT
TOWEL GREEN STERILE FF (TOWEL DISPOSABLE) ×1 IMPLANT
TRAY FOLEY MTR SLVR 14FR STAT (SET/KITS/TRAYS/PACK) IMPLANT
TRAY FOLEY MTR SLVR 16FR STAT (SET/KITS/TRAYS/PACK) ×1 IMPLANT
WATER STERILE IRR 1000ML POUR (IV SOLUTION) ×1 IMPLANT

## 2022-03-07 NOTE — Anesthesia Postprocedure Evaluation (Signed)
Anesthesia Post Note  Patient: Jill Austin  Procedure(s) Performed: Lumbar four-five Posterior Lumbar Interbody Fusion (Back)     Patient location during evaluation: PACU Anesthesia Type: General Level of consciousness: awake and alert, patient cooperative and oriented Pain management: pain level controlled Vital Signs Assessment: post-procedure vital signs reviewed and stable Respiratory status: spontaneous breathing, nonlabored ventilation and respiratory function stable Cardiovascular status: blood pressure returned to baseline and stable Postop Assessment: no apparent nausea or vomiting Anesthetic complications: no   No notable events documented.  Last Vitals:  Vitals:   03/07/22 1145 03/07/22 1200  BP: 127/71 112/74  Pulse: 77 73  Resp: 17 14  Temp:    SpO2: 94% 94%    Last Pain:  Vitals:   03/07/22 1200  TempSrc:   PainSc: 5                  Sopheap Boehle,E. Rama Mcclintock

## 2022-03-07 NOTE — Progress Notes (Signed)
Pharmacy Antibiotic Note  Jill Austin is a 76 y.o. female admitted on 03/07/2022 after Bilateral laminotomies and decompression L4 and L5 nerve roots  Pharmacy has been consulted for Vancomycin dosing. No drains noted.   Plan: Vancomycin '1000mg'$  IV x 1  Height: '5\' 4"'$  (162.6 cm) Weight: 73.9 kg (163 lb) IBW/kg (Calculated) : 54.7  Temp (24hrs), Avg:98.4 F (36.9 C), Min:97.8 F (36.6 C), Max:98.7 F (37.1 C)  Recent Labs  Lab 03/02/22 1046  WBC 6.3  CREATININE 0.85    Estimated Creatinine Clearance: 54.6 mL/min (by C-G formula based on SCr of 0.85 mg/dL).    Allergies  Allergen Reactions   Penicillins Other (See Comments)    headache Has patient had a PCN reaction causing immediate rash, facial/tongue/throat swelling, SOB or lightheadedness with hypotension: No Has patient had a PCN reaction causing severe rash involving mucus membranes or skin necrosis: No Has patient had a PCN reaction that required hospitalization: No Has patient had a PCN reaction occurring within the last 10 years: No If all of the above answers are "NO", then may proceed with Cephalosporin use.    Vicodin [Hydrocodone-Acetaminophen] Other (See Comments)    Hyper    Erythromycin Base Dermatitis    Zani Kyllonen A. Levada Dy, PharmD, BCPS, FNKF Clinical Pharmacist [place] Please utilize Amion for appropriate phone number to reach the unit pharmacist ([place])

## 2022-03-07 NOTE — H&P (Signed)
Jill Austin is an 76 y.o. female.   Chief Complaint: Back and left hip pain HPI: Jill Austin is a 76 year old individual whose had significant left hip pain she was seen and worked up by her orthopedist to determine that her hip was fine but noted that she had a spondylolisthesis.  She has had extensive conservative care for her lumbar spine and having failed this is noted progressive pain and weakness in the left lower extremity right leg seems relatively unaffected.  An MRI demonstrates a high-grade stenosis at L4-L5 the other levels appear reasonably healthy save for some facet arthropathy.  He is now being admitted to undergo surgical decompression and stabilization at L4-L5.  Past Medical History:  Diagnosis Date   Arthritis    Breast cancer ([place]) 05/08/12   invasive mammary ca, ER/PR+, HER 2 -   GERD (gastroesophageal reflux disease)    Hypertension    PONV (postoperative nausea and vomiting)    S/P radiation therapy 4-12 wks ago 08/06/12-09/24/12   left breast   Wears glasses     Past Surgical History:  Procedure Laterality Date   BREAST LUMPECTOMY WITH NEEDLE LOCALIZATION AND AXILLARY SENTINEL LYMPH NODE BX Left 06/05/2012   Procedure: BREAST LUMPECTOMY WITH NEEDLE LOCALIZATION AND AXILLARY SENTINEL LYMPH NODE BX;  Surgeon: Haywood Lasso, MD;  Location: [facility];  Service: General;  Laterality: Left;   COLONOSCOPY     DILATION AND CURETTAGE OF UTERUS     EYE SURGERY     left cataract with lens implant   EYE SURGERY     left eye peel    EYE SURGERY Right 12/31/2021   HAND OSTEOPLASTY  2007   left [facility]   KNEE ARTHROPLASTY Right 06/22/2017   Procedure: RIGHT TOTAL KNEE ARTHROPLASTY WITH COMPUTR NAVIGATION;  Surgeon: Rod Can, MD;  Location: [facility];  Service: Orthopedics;  Laterality: Right;  Needs RNFA   POLYPECTOMY     uterine    History reviewed. No pertinent family history. Social History:  reports that she has never smoked. She has never used  smokeless tobacco. She reports current alcohol use of about 10.0 standard drinks of alcohol per week. She reports that she does not use drugs.  Allergies:  Allergies  Allergen Reactions   Penicillins Other (See Comments)    headache Has patient had a PCN reaction causing immediate rash, facial/tongue/throat swelling, SOB or lightheadedness with hypotension: No Has patient had a PCN reaction causing severe rash involving mucus membranes or skin necrosis: No Has patient had a PCN reaction that required hospitalization: No Has patient had a PCN reaction occurring within the last 10 years: No If all of the above answers are "NO", then may proceed with Cephalosporin use.    Vicodin [Hydrocodone-Acetaminophen] Other (See Comments)    Hyper    Erythromycin Base Dermatitis    Medications Prior to Admission  Medication Sig Dispense Refill   amLODipine (NORVASC) 5 MG tablet Take 5 mg by mouth daily.      Calcium Carbonate-Vitamin D (CALCIUM 600 + D PO) Take 600 mg by mouth daily.     Carboxymethylcellul-Glycerin (LUBRICATING EYE DROPS OP) Place 1 drop into both eyes daily as needed (dry eyes).     diphenhydrAMINE (BENADRYL) 25 MG tablet Take 50 mg by mouth daily as needed for allergies.     hydrochlorothiazide (MICROZIDE) 12.5 MG capsule Take 12.5 mg by mouth daily.     loratadine (CLARITIN) 10 MG tablet Take 10 mg by mouth daily.  meloxicam (MOBIC) 15 MG tablet Take 15 mg by mouth daily.      Multiple Vitamin (MULTIVITAMIN) capsule Take 1 capsule by mouth daily.     Multiple Vitamins-Minerals (OCUVITE PO) Take 1 tablet by mouth daily.     omeprazole (PRILOSEC) 20 MG capsule Take 20 mg by mouth daily after breakfast.     oxymetazoline (AFRIN) 0.05 % nasal spray Place 1 spray into both nostrils daily as needed for congestion.     pravastatin (PRAVACHOL) 20 MG tablet Take 20 mg by mouth daily.     sertraline (ZOLOFT) 100 MG tablet Take 100 mg by mouth daily.      traZODone (DESYREL) 50 MG  tablet Take 50 mg by mouth at bedtime.     calcium carbonate (TUMS - DOSED IN MG ELEMENTAL CALCIUM) 500 MG chewable tablet Chew 1-2 tablets by mouth daily as needed for indigestion or heartburn.     Simethicone (GAS-X PO) Take 1-2 tablets by mouth daily as needed (gas).      Results for orders placed or performed during the hospital encounter of 03/07/22 (from the past 48 hour(s))  Surgical pcr screen     Status: None   Collection Time: 03/07/22  6:20 AM   Specimen: Nasal Mucosa; Nasal Swab  Result Value Ref Range   MRSA, PCR NEGATIVE NEGATIVE   Staphylococcus aureus NEGATIVE NEGATIVE    Comment: (NOTE) The Xpert SA Assay (FDA approved for NASAL specimens in patients 57 years of age and older), is one component of a comprehensive surveillance program. It is not intended to diagnose infection nor to guide or monitor treatment. Performed at [facility], [place] [address] [place], [place] [address]    No results found.  Review of Systems  Constitutional:  Positive for activity change.  Musculoskeletal:  Positive for back pain and gait problem.  Neurological:  Positive for weakness and numbness.  All other systems reviewed and are negative.   Blood pressure (!) 154/88, pulse 66, temperature 97.8 F (36.6 C), temperature source Oral, resp. rate 17, height '5\' 4"'$  (1.626 m), weight 73.9 kg, SpO2 95 %. Physical Exam Constitutional:      Appearance: Normal appearance. She is normal weight.  HENT:     Head: Normocephalic and atraumatic.     Right Ear: Tympanic membrane, ear canal and external ear normal.     Left Ear: Tympanic membrane, ear canal and external ear normal.     Nose: Nose normal.     Mouth/Throat:     Mouth: Mucous membranes are moist.     Pharynx: Oropharynx is clear.  Eyes:     Extraocular Movements: Extraocular movements intact.     Conjunctiva/sclera: Conjunctivae normal.     Pupils: Pupils are equal, round, and reactive to light.  Cardiovascular:     Rate  and Rhythm: Normal rate and regular rhythm.     Pulses: Normal pulses.     Heart sounds: Normal heart sounds.  Pulmonary:     Effort: Pulmonary effort is normal.     Breath sounds: Normal breath sounds.  Abdominal:     General: Abdomen is flat. Bowel sounds are normal.     Palpations: Abdomen is soft.  Musculoskeletal:     Cervical back: Normal range of motion and neck supple.     Comments: Positive straight leg raising on the left at 15 degrees Patrick's maneuver is negative on the left and the right  Skin:    General: Skin is warm and  dry.     Capillary Refill: Capillary refill takes less than 2 seconds.  Neurological:     Mental Status: She is alert.     Comments: Mild tibialis anterior weakness on the left side compared to the right of 4-5  Psychiatric:        Mood and Affect: Mood normal.        Behavior: Behavior normal.        Thought Content: Thought content normal.        Judgment: Judgment normal.      Assessment/Plan Spondylolisthesis L4-L5 with high-grade stenosis.  Neurogenic claudication.  Lumbar radiculopathy.  Plan: Posterior lumbar decompression with interbody arthrodesis and posterolateral arthrodesis also.  Patient will undergo screw augmentation secondary to osteopenia noted on bone mineral density study.  Earleen Newport, MD 03/07/2022, 7:49 AM

## 2022-03-07 NOTE — OR Nursing (Signed)
Kyphon hy-r radioplaque bone cement used with screw system

## 2022-03-07 NOTE — Anesthesia Procedure Notes (Signed)
Procedure Name: Intubation Date/Time: 03/07/2022 8:09 AM  Performed by: Glynda Jaeger, CRNAPre-anesthesia Checklist: Patient identified, Patient being monitored, Timeout performed, Emergency Drugs available and Suction available Patient Re-evaluated:Patient Re-evaluated prior to induction Oxygen Delivery Method: Circle System Utilized Preoxygenation: Pre-oxygenation with 100% oxygen Induction Type: IV induction Ventilation: Mask ventilation without difficulty Laryngoscope Size: Mac and 3 Grade View: Grade I Tube type: Oral Tube size: 7.5 mm Number of attempts: 1 Airway Equipment and Method: Stylet Placement Confirmation: ETT inserted through vocal cords under direct vision, positive ETCO2 and breath sounds checked- equal and bilateral Secured at: 22 cm Tube secured with: Tape Dental Injury: Teeth and Oropharynx as per pre-operative assessment

## 2022-03-07 NOTE — Progress Notes (Signed)
Orthopedic Tech Progress Note Patient Details:  Jill Austin 04/04/1946 [id number]  Ortho Devices Type of Ortho Device: Lumbar corsett Ortho Device/Splint Interventions: Ordered  Left LSO at desk on [employer]    Jaclynn Laumann A Cleburne Savini 03/07/2022, 11:41 AM

## 2022-03-07 NOTE — Op Note (Signed)
Date of surgery: 03/07/2022 Preoperative diagnosis: Spondylolisthesis L4-L5 with left lumbar radiculopathy postoperative diagnosis: Same Procedure: Bilateral laminotomies and decompression L4 and L5 nerve roots with more work than required for simple interbody technique.  Posterior lumbar interbody arthrodesis with peek spacers local autograft allograft and Proteios posterolateral arthrodesis with same combination of graft pedicle screw fixation L4-L5 with fluoroscopic guidance. Surgeon: Kristeen Miss Anesthesia: General endotracheal Indications: Jill Austin is a 76 year old individual whose had significant back and left lower extremity pain has been treated conservatively she has a profound spondylolisthesis with a high-grade stenosis she has weakness in the left leg that has been evolving and she has been advised regarding the need for surgical decompression arthrodesis.  Procedure: The patient was brought to the operating room supine on the stretcher.  After the smooth induction of general endotracheal anesthesia, she was carefully turned prone.  The back was prepped with alcohol DuraPrep and draped in a sterile fashion.  Midline incision was created and carried down to the lumbodorsal fascia which was opened on either side of midline.  This second radiographic confirmation of the L4-L5 space was obtained after identifying the laminar arch of L4.  Then the self-retaining retractor was placed deep in the wound and the subperiosteal dissection was carried out over the facet joints at L4-L5 to expose the region of the transverse process of L4 and the transverse process of L5.  These areas were decorticated and packed off for later use and grafting.  Then a laminectomy was created removing the inferior margin lamina of L4 out to and including the entirety of the facet at L4-L5.  This bone was harvested for use and bone graft.  The cartilaginous facet capsules were then removed.  The anterior transverse area  was then decompressed of significant redundant thickened yellow ligament.  The common dural tube was decompressed and the path of the L4 nerve root superiorly and the L5 nerve root inferiorly was identified and decompressed.  The nerves were carefully protected.  Then the disc space at L4-L5 was isolated bilaterally.  A discectomy was then performed at L4-L5 using a combination of curettes and rongeurs to evacuate a substantial quantity of severely degenerated desiccated disc material.  This allowed for distraction of the disc space and while using a distractor on 1 side the opposite side was cleared.  Then by opening the interspace and carefully checking to make sure that all the endplate material was removed from within the disc space the space was sized for an appropriate size spacer it was felt that an 8 x 9 x 23 mm spacer with 12 degrees lordosis would fit best into this interval.  2 spacers were then filled with autograft allograft and Proteios.  A total of 18 cc of the graft was packed into the interspace also.  Lateral gutters which had been decorticated were then packed with an additional 6 cc on each side and the intertransverse space.  Pedicle entry sites were then chosen at L4 and L5.  Because the patient was noted to have some softened bone and also had a diagnosis of osteopenia 6.5 x 45 mm cannulated screws were placed under fluoroscopic guidance into L4 and L5.  The screws were both cannulated and fenestrated and then towers were attached to allow placement of 1 cc of methacrylate into each pedicle screw.  This allowed for good reinforcement of the pedicle screws.  Once the augmentation procedure was completed final radiographs were obtained and then 235 mm precontoured rods were placed  between the pedicle screws and tightened in a neutral construct.  With the lateral gutters already being packed care was taken to make sure that the common dural tube and the L4 and L5 nerve roots remained well  decompressed no spinal fluid leaks were noted during this procedure hemostasis in the soft tissues was obtained meticulously and then the lumbodorsal fascia was closed with #1 Vicryl in interrupted fashion 2-0 Vicryl was used in the subcutaneous tissues 3-0 Vicryl subcuticularly and Dermabond was used on the skin.  Patient tolerated procedure well was returned to recovery room in stable condition.  1 loss is estimated 200 cc

## 2022-03-07 NOTE — Transfer of Care (Signed)
Immediate Anesthesia Transfer of Care Note  Patient: Jill Austin  Procedure(s) Performed: Lumbar four-five Posterior Lumbar Interbody Fusion (Back)  Patient Location: PACU  Anesthesia Type:General  Level of Consciousness: awake, alert , oriented, patient cooperative, and responds to stimulation  Airway & Oxygen Therapy: Patient Spontanous Breathing and Patient connected to face mask oxygen  Post-op Assessment: Report given to RN and Post -op Vital signs reviewed and stable  Post vital signs: Reviewed and stable  Last Vitals:  Vitals Value Taken Time  BP 141/74 03/07/22 1130  Temp    Pulse 82 03/07/22 1132  Resp 17 03/07/22 1132  SpO2 97 % 03/07/22 1132  Vitals shown include unvalidated device data.  Last Pain:  Vitals:   03/07/22 0629  TempSrc:   PainSc: 0-No pain         Complications: No notable events documented.

## 2022-03-08 DIAGNOSIS — M48062 Spinal stenosis, lumbar region with neurogenic claudication: Secondary | ICD-10-CM | POA: Diagnosis not present

## 2022-03-08 DIAGNOSIS — Z79899 Other long term (current) drug therapy: Secondary | ICD-10-CM | POA: Diagnosis not present

## 2022-03-08 DIAGNOSIS — M4316 Spondylolisthesis, lumbar region: Secondary | ICD-10-CM | POA: Diagnosis not present

## 2022-03-08 DIAGNOSIS — M5416 Radiculopathy, lumbar region: Secondary | ICD-10-CM | POA: Diagnosis not present

## 2022-03-08 DIAGNOSIS — Z853 Personal history of malignant neoplasm of breast: Secondary | ICD-10-CM | POA: Diagnosis not present

## 2022-03-08 DIAGNOSIS — Z96651 Presence of right artificial knee joint: Secondary | ICD-10-CM | POA: Diagnosis not present

## 2022-03-08 LAB — CBC
HCT: 28.1 % — ABNORMAL LOW (ref 36.0–46.0)
Hemoglobin: 9.5 g/dL — ABNORMAL LOW (ref 12.0–15.0)
MCH: 30.3 pg (ref 26.0–34.0)
MCHC: 33.8 g/dL (ref 30.0–36.0)
MCV: 89.5 fL (ref 80.0–100.0)
Platelets: 132 10*3/uL — ABNORMAL LOW (ref 150–400)
RBC: 3.14 MIL/uL — ABNORMAL LOW (ref 3.87–5.11)
RDW: 13.5 % (ref 11.5–15.5)
WBC: 7.3 10*3/uL (ref 4.0–10.5)
nRBC: 0 % (ref 0.0–0.2)

## 2022-03-08 LAB — BASIC METABOLIC PANEL
Anion gap: 5 (ref 5–15)
BUN: 10 mg/dL (ref 8–23)
CO2: 27 mmol/L (ref 22–32)
Calcium: 8.2 mg/dL — ABNORMAL LOW (ref 8.9–10.3)
Chloride: 105 mmol/L (ref 98–111)
Creatinine, Ser: 0.65 mg/dL (ref 0.44–1.00)
GFR, Estimated: >60 mL/min (ref >60)
Glucose, Bld: 113 mg/dL — ABNORMAL HIGH (ref 70–99)
Potassium: 3.5 mmol/L (ref 3.5–5.1)
Sodium: 137 mmol/L (ref 135–145)

## 2022-03-08 MED ORDER — OXYCODONE-ACETAMINOPHEN 5-325 MG PO TABS: 1.0000 | ORAL_TABLET | ORAL | 0 refills | Status: AC | PRN

## 2022-03-08 MED ORDER — METHOCARBAMOL 500 MG PO TABS: 500.0000 mg | ORAL_TABLET | Freq: Four times a day (QID) | ORAL | 3 refills | Status: AC | PRN

## 2022-03-08 MED FILL — Thrombin For Soln 5000 Unit: CUTANEOUS | Qty: 5000 | Status: AC

## 2022-03-08 NOTE — Plan of Care (Signed)
Pt doing well. Pt and family given D/C instructions with verbal understanding. Rx's were sent to the pharmacy by MD. Pt's incision is clean and dry with no sign of infection. Pt's IV was removed prior to D/C. Pt D/C'd home via wheelchair per MD order. Pt is stable @ D/C and has no other needs at this time. Cheyeanne Roadcap, RN  

## 2022-03-08 NOTE — Evaluation (Signed)
Physical Therapy Evaluation Patient Details Name: Jill Austin MRN: [id number] DOB: November 18, 1945 Today's Date: 03/08/2022  History of Present Illness  Patient is a 76 y/o female who presents on 03/07/22 for L4-5 PLIF. PMh includes HTN, breast ca.  Clinical Impression  Patient presents with pain and post surgical deficits s/p above surgery. Pt lives at home with her spouse and reports being independent for ADLs/IADLs PTA. Pt has been using a SPC PRN the last 10 months. Today, pt requires supervision for bed mobility, transfers and gait training with use of RW for support. Tolerated stair training as well, encouraged pt to utilize stairs in front of home due to having access to a rail and having family support for safety. Education re: back precautions, brace application, walking program, positioning, log roll technique, car transfer etc. Pt's daughters will be staying for 2 weeks to assist as needed. Will follow acutely to maximize independence and mobility prior to return home.       Recommendations for follow up therapy are one component of a multi-disciplinary discharge planning process, led by the attending physician.  Recommendations may be updated based on patient status, additional functional criteria and insurance authorization.  Follow Up Recommendations No PT follow up      Assistance Recommended at Discharge Intermittent Supervision/Assistance  Patient can return home with the following  A little help with walking and/or transfers;A little help with bathing/dressing/bathroom;Assistance with cooking/housework;Assist for transportation;Help with stairs or ramp for entrance    Equipment Recommendations None recommended by PT  Recommendations for Other Services       Functional Status Assessment Patient has had a recent decline in their functional status and demonstrates the ability to make significant improvements in function in a reasonable and predictable amount of time.      Precautions / Restrictions Precautions Precautions: ICD/Pacemaker;Back Precaution Booklet Issued: Yes (comment) Precaution Comments: Reviewed handout and precautions Required Braces or Orthoses: Spinal Brace Spinal Brace: Lumbar corset;Applied in sitting position Restrictions Weight Bearing Restrictions: No      Mobility  Bed Mobility Overal bed mobility: Needs Assistance Bed Mobility: Rolling, Sidelying to Sit, Sit to Sidelying Rolling: Supervision Sidelying to sit: Supervision     Sit to sidelying: Supervision General bed mobility comments: HOB flat, no use of rails to simulate home, cues for log roll technique.    Transfers Overall transfer level: Needs assistance Equipment used: Rolling walker (2 wheels) Transfers: Sit to/from Stand Sit to Stand: Supervision           General transfer comment: Supervision for safety. Cues for hand placement/technique as pt wantign to pull up on RW.    Ambulation/Gait Ambulation/Gait assistance: Supervision Gait Distance (Feet): 250 Feet Assistive device: Rolling walker (2 wheels) Gait Pattern/deviations: Step-through pattern, Decreased stride length Gait velocity: decreased     General Gait Details: Slow, mostly steady gait with RW for support, heavy reliance on UEs, a few instances of bil knee instability but no buckling.  Stairs Stairs: Yes Stairs assistance: Min guard Stair Management: Step to pattern, One rail Left Number of Stairs: 4 General stair comments: Cues for technique/.safety.  Wheelchair Mobility    Modified Rankin (Stroke Patients Only)       Balance Overall balance assessment: Needs assistance Sitting-balance support: Feet supported, No upper extremity supported Sitting balance-Leahy Scale: Good Sitting balance - Comments: Able to donn brace with setup   Standing balance support: During functional activity, Reliant on assistive device for balance Standing balance-Leahy Scale: Poor Standing  balance comment: Requires  UE support in standing.                             Pertinent Vitals/Pain Pain Assessment Pain Assessment: Faces Faces Pain Scale: Hurts even more Pain Location: back Pain Descriptors / Indicators: Grimacing, Guarding, Sore, Operative site guarding Pain Intervention(s): Monitored during session, Repositioned, Patient requesting pain meds-RN notified    Home Living Family/patient expects to be discharged to:: Private residence Living Arrangements: Spouse/significant other Available Help at Discharge: Family;Available 24 hours/day Type of Home: House Home Access: Stairs to enter Entrance Stairs-Rails: Right;None Technical brewer of Steps: 2 vs 4   Home Layout: One level Home Equipment: Conservation officer, nature (2 wheels);Cane - single point;Shower seat Additional Comments: Daughters will be staying for 2 weeks    Prior Function Prior Level of Function : Independent/Modified Independent             Mobility Comments: Has been using SPC as needed over the last 10 months, drives, no falls reported. Does IADls ADLs Comments: independent     Hand Dominance        Extremity/Trunk Assessment   Upper Extremity Assessment Upper Extremity Assessment: Defer to OT evaluation    Lower Extremity Assessment Lower Extremity Assessment: Overall WFL for tasks assessed    Cervical / Trunk Assessment Cervical / Trunk Assessment: Back Surgery  Communication   Communication: No difficulties  Cognition Arousal/Alertness: Awake/alert Behavior During Therapy: WFL for tasks assessed/performed Overall Cognitive Status: Within Functional Limits for tasks assessed                                          General Comments General comments (skin integrity, edema, etc.): Bandage- clean, dry and intact.    Exercises     Assessment/Plan    PT Assessment Patient needs continued PT services  PT Problem List Decreased mobility;Decreased  strength;Pain;Decreased balance;Decreased skin integrity;Decreased knowledge of precautions       PT Treatment Interventions Therapeutic exercise;Gait training;Balance training;Stair training;Functional mobility training;Therapeutic activities;Patient/family education    PT Goals (Current goals can be found in the Care Plan section)  Acute Rehab PT Goals Patient Stated Goal: to go home, improve pain PT Goal Formulation: With patient Time For Goal Achievement: 03/22/22 Potential to Achieve Goals: Good    Frequency Min 5X/week     Co-evaluation               AM-PAC PT "6 Clicks" Mobility  Outcome Measure Help needed turning from your back to your side while in a flat bed without using bedrails?: A Little Help needed moving from lying on your back to sitting on the side of a flat bed without using bedrails?: A Little Help needed moving to and from a bed to a chair (including a wheelchair)?: A Little Help needed standing up from a chair using your arms (e.g., wheelchair or bedside chair)?: A Little Help needed to walk in hospital room?: A Little Help needed climbing 3-5 steps with a railing? : A Little 6 Click Score: 18    End of Session Equipment Utilized During Treatment: Gait belt;Back brace Activity Tolerance: Patient limited by pain;Patient tolerated treatment well Patient left: in bed;with call bell/phone within reach Nurse Communication: Mobility status PT Visit Diagnosis: Pain;Other abnormalities of gait and mobility (R26.89) Pain - part of body:  (back)    Time: 1610-9604 PT  Time Calculation (min) (ACUTE ONLY): 20 min   Charges:   PT Evaluation $PT Eval Moderate Complexity: 1 Mod          Marisa Severin, PT, DPT Acute Rehabilitation Services Secure chat preferred Office [place] 03/08/2022, 8:42 AM

## 2022-03-08 NOTE — Care Management CC44 (Signed)
Condition Code 44 Documentation Completed  Patient Details  Name: Jill Austin MRN: [id number] Date of Birth: Dec 22, 1945   Condition Code 44 given:  Yes Patient signature on Condition Code 44 notice:  Yes Documentation of 2 MD's agreement:  Yes Code 44 added to claim:  Yes    Pollie Friar, RN 03/08/2022, 3:20 PM

## 2022-03-08 NOTE — Discharge Summary (Signed)
Physician Discharge Summary  Patient ID: Jill Austin MRN: [id number] DOB/AGE: [birth year] 77 y.o.  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  Lumbar spondylolisthesis L4-5 Principal Problem:   Spondylolisthesis of lumbar region   Discharged Condition: good  Hospital Course: Patient underwent surgical intervention tolerated surgery well  Consults: None  Significant Diagnostic Studies: None  Treatments: See op note  Discharge Exam: Blood pressure (!) 116/54, pulse 81, temperature 99.5 F (37.5 C), temperature source Oral, resp. rate 16, height '5\' 4"'$  (1.626 m), weight 73.9 kg, SpO2 98 %. Station and gait are intact motor function is intact.  Disposition: Discharge disposition: 01-Home or Self Care       Discharge Instructions     Call MD for:  redness, tenderness, or signs of infection (pain, swelling, redness, odor or green/yellow discharge around incision site)   Complete by: As directed    Call MD for:  severe uncontrolled pain   Complete by: As directed    Call MD for:  temperature >100.4   Complete by: As directed    Diet - low sodium heart healthy   Complete by: As directed    Discharge wound care:   Complete by: As directed    Okay to shower. Do not apply salves or appointments to incision. No heavy lifting with the upper extremities greater than 10 pounds. May resume driving when not requiring pain medication and patient feels comfortable with doing so.   Incentive spirometry RT   Complete by: As directed    Increase activity slowly   Complete by: As directed       Allergies as of 03/08/2022       Reactions   Penicillins Other (See Comments)   headache Has patient had a PCN reaction causing immediate rash, facial/tongue/throat swelling, SOB or lightheadedness with  hypotension: No Has patient had a PCN reaction causing severe rash involving mucus membranes or skin necrosis: No Has patient had a PCN reaction that required hospitalization: No Has patient had a PCN reaction occurring within the last 10 years: No If all of the above answers are "NO", then may proceed with Cephalosporin use.   Vicodin [hydrocodone-acetaminophen] Other (See Comments)   Hyper   Erythromycin Base Dermatitis        Medication List     TAKE these medications    amLODipine 5 MG tablet Commonly known as: NORVASC Take 5 mg by mouth daily.   CALCIUM 600 + D PO Take 600 mg by mouth daily.   calcium carbonate 500 MG chewable tablet Commonly known as: TUMS - dosed in mg elemental calcium Chew 1-2 tablets by mouth daily as needed for indigestion or heartburn.   diphenhydrAMINE 25 MG tablet Commonly known as: BENADRYL Take 50 mg by mouth daily as needed for allergies.   GAS-X PO Take 1-2 tablets by mouth daily as needed (gas).   hydrochlorothiazide 12.5 MG capsule Commonly known as: MICROZIDE Take 12.5 mg by mouth daily.   loratadine 10 MG tablet Commonly known as: CLARITIN Take 10 mg by mouth daily.   LUBRICATING EYE DROPS OP Place 1 drop into both eyes daily as needed (dry eyes).   meloxicam 15 MG tablet Commonly known as: MOBIC Take 15 mg by mouth daily.   methocarbamol 500 MG tablet Commonly known as: ROBAXIN Take 1 tablet (500 mg total) by mouth every 6 (six) hours as needed for muscle  spasms.   multivitamin capsule Take 1 capsule by mouth daily.   OCUVITE PO Take 1 tablet by mouth daily.   omeprazole 20 MG capsule Commonly known as: PRILOSEC Take 20 mg by mouth daily after breakfast.   oxyCODONE-acetaminophen 5-325 MG tablet Commonly known as: PERCOCET/ROXICET Take 1-2 tablets by mouth every 4 (four) hours as needed for moderate pain or severe pain.   oxymetazoline 0.05 % nasal spray Commonly known as: AFRIN Place 1 spray into both  nostrils daily as needed for congestion.   pravastatin 20 MG tablet Commonly known as: PRAVACHOL Take 20 mg by mouth daily.   sertraline 100 MG tablet Commonly known as: ZOLOFT Take 100 mg by mouth daily.   traZODone 50 MG tablet Commonly known as: DESYREL Take 50 mg by mouth at bedtime.               Discharge Care Instructions  (From admission, onward)           Start     Ordered   03/08/22 0000  Discharge wound care:       Comments: Okay to shower. Do not apply salves or appointments to incision. No heavy lifting with the upper extremities greater than 10 pounds. May resume driving when not requiring pain medication and patient feels comfortable with doing so.   03/08/22 1452             Signed: Blanchie Dessert [name] 03/08/2022, 2:52 PM

## 2022-03-08 NOTE — Care Management Obs Status (Signed)
[facility] NOTIFICATION   Patient Details  Name: Jill Austin MRN: [id number] Date of Birth: October 02, 1945   Medicare Observation Status Notification Given:  Yes    Pollie Friar, RN 03/08/2022, 3:20 PM

## 2022-03-08 NOTE — Evaluation (Signed)
Occupational Therapy Evaluation Patient Details Name: Jill Austin MRN: [id number] DOB: 11-26-1945 Today's Date: 03/08/2022   History of Present Illness Patient is a 76 y/o female who presents on 03/07/22 for L4-5 PLIF. PMh includes HTN, breast ca.   Clinical Impression   Jill Austin was evaluated s/p the above back surgery, she is indep at baseline. Upon evaluation pt had functional limitations due to pain, decreased activity tolerance, knowledged of back precautions and compensatory techniques. Overall she required up to min A for LB ADLs and superivsion-min G for mobility. Family arrived at the end of the session and verbalized understanding of precautions and brace wear scheduled. Recommend pt d/c home with support of family.      Recommendations for follow up therapy are one component of a multi-disciplinary discharge planning process, led by the attending physician.  Recommendations may be updated based on patient status, additional functional criteria and insurance authorization.   Follow Up Recommendations  No OT follow up     Assistance Recommended at Discharge Set up Supervision/Assistance  Patient can return home with the following A little help with walking and/or transfers;A little help with bathing/dressing/bathroom;Assistance with cooking/housework;Help with stairs or ramp for entrance;Assist for transportation    Functional Status Assessment  Patient has had a recent decline in their functional status and demonstrates the ability to make significant improvements in function in a reasonable and predictable amount of time.  Equipment Recommendations  None recommended by OT    Recommendations for Other Services       Precautions / Restrictions Precautions Precautions: Back;Fall Precaution Booklet Issued: Yes (comment) Precaution Comments: Reviewed handout and precautions Required Braces or Orthoses: Spinal Brace Spinal Brace: Lumbar corset;Applied in sitting  position Restrictions Weight Bearing Restrictions: No      Mobility Bed Mobility Overal bed mobility: Needs Assistance Bed Mobility: Rolling, Sidelying to Sit, Sit to Sidelying Rolling: Supervision Sidelying to sit: Supervision     Sit to sidelying: Supervision      Transfers Overall transfer level: Needs assistance Equipment used: Rolling walker (2 wheels) Transfers: Sit to/from Stand Sit to Stand: Supervision                  Balance Overall balance assessment: Needs assistance Sitting-balance support: Feet supported, No upper extremity supported Sitting balance-Leahy Scale: Good     Standing balance support: During functional activity, Reliant on assistive device for balance Standing balance-Leahy Scale: Poor                             ADL either performed or assessed with clinical judgement   ADL Overall ADL's : Needs assistance/impaired Eating/Feeding: Independent;Sitting   Grooming: Min guard;Standing   Upper Body Bathing: Set up;Sitting   Lower Body Bathing: Min guard;Sit to/from stand   Upper Body Dressing : Set up;Sitting   Lower Body Dressing: Minimal assistance;Sit to/from stand   Toilet Transfer: Min guard;Ambulation;Rolling walker (2 wheels)   Toileting- Clothing Manipulation and Hygiene: Supervision/safety;Sitting/lateral lean       Functional mobility during ADLs: Min guard;Rolling walker (2 wheels) General ADL Comments: cues for compensatory tehcniuqes throughout     Vision Baseline Vision/History: 1 Wears glasses Vision Assessment?: No apparent visual deficits     Perception Perception Perception Tested?: No   Praxis Praxis Praxis tested?: Within functional limits    Pertinent Vitals/Pain Pain Assessment Pain Assessment: 0-10 Pain Score: 6  Faces Pain Scale: Hurts even more Pain Location: back Pain Descriptors /  Indicators: Grimacing, Guarding, Sore, Operative site guarding Pain Intervention(s): Limited  activity within patient's tolerance     Hand Dominance Right   Extremity/Trunk Assessment Upper Extremity Assessment Upper Extremity Assessment: Overall WFL for tasks assessed   Lower Extremity Assessment Lower Extremity Assessment: Defer to PT evaluation   Cervical / Trunk Assessment Cervical / Trunk Assessment: Back Surgery   Communication Communication Communication: No difficulties   Cognition Arousal/Alertness: Awake/alert Behavior During Therapy: WFL for tasks assessed/performed Overall Cognitive Status: Within Functional Limits for tasks assessed                                       General Comments  VSS on RA, family arrived at the end of the session    Exercises     Shoulder Instructions      Home Living Family/patient expects to be discharged to:: Private residence Living Arrangements: Spouse/significant other Available Help at Discharge: Family;Available 24 hours/day Type of Home: House Home Access: Stairs to enter [employer] of Steps: 2 vs 4 Entrance Stairs-Rails: Right;None Home Layout: One level     Bathroom Shower/Tub: Occupational psychologist: Standard     Home Equipment: Conservation officer, nature (2 wheels);Cane - single point;Shower seat   Additional Comments: Daughters will be staying for 2 weeks      Prior Functioning/Environment Prior Level of Function : Independent/Modified Independent;Driving             Mobility Comments: Has been using SPC as needed over the last 10 months, drives, no falls reported. Does IADls ADLs Comments: independent        OT Problem List: Decreased range of motion;Decreased strength;Decreased activity tolerance;Decreased safety awareness;Decreased knowledge of use of DME or AE;Decreased knowledge of precautions      OT Treatment/Interventions:      OT Goals(Current goals can be found in the care plan section) Acute Rehab OT Goals Patient Stated Goal: home OT Goal  Formulation: With patient Time For Goal Achievement: 03/08/22 Potential to Achieve Goals: Good  OT Frequency:      Co-evaluation              AM-PAC OT "6 Clicks" Daily Activity     Outcome Measure Help from another person eating meals?: None Help from another person taking care of personal grooming?: A Little Help from another person toileting, which includes using toliet, bedpan, or urinal?: A Little Help from another person bathing (including washing, rinsing, drying)?: A Little Help from another person to put on and taking off regular upper body clothing?: None Help from another person to put on and taking off regular lower body clothing?: A Little 6 Click Score: 20   End of Session Equipment Utilized During Treatment: Back brace Nurse Communication: Mobility status  Activity Tolerance: Patient tolerated treatment well Patient left: in bed;with call bell/phone within reach;with family/visitor present  OT Visit Diagnosis: Unsteadiness on feet (R26.81);Other abnormalities of gait and mobility (R26.89);Muscle weakness (generalized) (M62.81)                Time: 5366-4403 OT Time Calculation (min): 20 min Charges:  OT General Charges $OT Visit: 1 Visit OT Evaluation $OT Eval Moderate Complexity: 1 Mod    Kaianna Dolezal D Causey 03/08/2022, 9:46 AM

## 2022-03-25 DIAGNOSIS — M4316 Spondylolisthesis, lumbar region: Secondary | ICD-10-CM | POA: Diagnosis not present

## 2022-05-13 DIAGNOSIS — M4316 Spondylolisthesis, lumbar region: Secondary | ICD-10-CM | POA: Diagnosis not present

## 2022-05-23 DIAGNOSIS — M545 Low back pain, unspecified: Secondary | ICD-10-CM | POA: Diagnosis not present

## 2022-05-27 DIAGNOSIS — M545 Low back pain, unspecified: Secondary | ICD-10-CM | POA: Diagnosis not present

## 2022-05-31 DIAGNOSIS — M545 Low back pain, unspecified: Secondary | ICD-10-CM | POA: Diagnosis not present

## 2022-06-02 DIAGNOSIS — M545 Low back pain, unspecified: Secondary | ICD-10-CM | POA: Diagnosis not present

## 2022-06-07 DIAGNOSIS — M545 Low back pain, unspecified: Secondary | ICD-10-CM | POA: Diagnosis not present

## 2022-06-09 DIAGNOSIS — M545 Low back pain, unspecified: Secondary | ICD-10-CM | POA: Diagnosis not present

## 2022-06-14 DIAGNOSIS — M545 Low back pain, unspecified: Secondary | ICD-10-CM | POA: Diagnosis not present

## 2022-06-16 DIAGNOSIS — M545 Low back pain, unspecified: Secondary | ICD-10-CM | POA: Diagnosis not present

## 2022-06-20 DIAGNOSIS — M545 Low back pain, unspecified: Secondary | ICD-10-CM | POA: Diagnosis not present

## 2022-06-23 DIAGNOSIS — M545 Low back pain, unspecified: Secondary | ICD-10-CM | POA: Diagnosis not present

## 2022-06-27 DIAGNOSIS — M545 Low back pain, unspecified: Secondary | ICD-10-CM | POA: Diagnosis not present

## 2022-06-30 DIAGNOSIS — M545 Low back pain, unspecified: Secondary | ICD-10-CM | POA: Diagnosis not present

## 2022-07-04 DIAGNOSIS — M545 Low back pain, unspecified: Secondary | ICD-10-CM | POA: Diagnosis not present

## 2022-07-06 DIAGNOSIS — M8580 Other specified disorders of bone density and structure, unspecified site: Secondary | ICD-10-CM | POA: Diagnosis not present

## 2022-07-06 DIAGNOSIS — Z1231 Encounter for screening mammogram for malignant neoplasm of breast: Secondary | ICD-10-CM | POA: Diagnosis not present

## 2022-07-06 DIAGNOSIS — I10 Essential (primary) hypertension: Secondary | ICD-10-CM | POA: Diagnosis not present

## 2022-07-06 DIAGNOSIS — E785 Hyperlipidemia, unspecified: Secondary | ICD-10-CM | POA: Diagnosis not present

## 2022-07-06 DIAGNOSIS — Z0000 Encounter for general adult medical examination without abnormal findings: Secondary | ICD-10-CM | POA: Diagnosis not present

## 2022-07-06 DIAGNOSIS — R7303 Prediabetes: Secondary | ICD-10-CM | POA: Diagnosis not present

## 2022-07-07 DIAGNOSIS — M545 Low back pain, unspecified: Secondary | ICD-10-CM | POA: Diagnosis not present

## 2022-07-20 DIAGNOSIS — M4316 Spondylolisthesis, lumbar region: Secondary | ICD-10-CM | POA: Diagnosis not present

## 2022-07-26 DIAGNOSIS — M810 Age-related osteoporosis without current pathological fracture: Secondary | ICD-10-CM | POA: Diagnosis not present

## 2022-07-26 DIAGNOSIS — M8589 Other specified disorders of bone density and structure, multiple sites: Secondary | ICD-10-CM | POA: Diagnosis not present

## 2022-07-27 DIAGNOSIS — M545 Low back pain, unspecified: Secondary | ICD-10-CM | POA: Diagnosis not present

## 2022-07-29 DIAGNOSIS — I10 Essential (primary) hypertension: Secondary | ICD-10-CM | POA: Diagnosis not present

## 2022-08-03 DIAGNOSIS — M545 Low back pain, unspecified: Secondary | ICD-10-CM | POA: Diagnosis not present

## 2022-08-31 DIAGNOSIS — E7800 Pure hypercholesterolemia, unspecified: Secondary | ICD-10-CM | POA: Diagnosis not present

## 2022-08-31 DIAGNOSIS — I10 Essential (primary) hypertension: Secondary | ICD-10-CM | POA: Diagnosis not present

## 2022-08-31 DIAGNOSIS — R7303 Prediabetes: Secondary | ICD-10-CM | POA: Diagnosis not present

## 2022-08-31 DIAGNOSIS — M810 Age-related osteoporosis without current pathological fracture: Secondary | ICD-10-CM | POA: Diagnosis not present

## 2022-09-28 DIAGNOSIS — M810 Age-related osteoporosis without current pathological fracture: Secondary | ICD-10-CM | POA: Diagnosis not present

## 2022-11-08 DIAGNOSIS — H524 Presbyopia: Secondary | ICD-10-CM | POA: Diagnosis not present

## 2022-11-08 DIAGNOSIS — Z961 Presence of intraocular lens: Secondary | ICD-10-CM | POA: Diagnosis not present

## 2022-11-08 DIAGNOSIS — D3132 Benign neoplasm of left choroid: Secondary | ICD-10-CM | POA: Diagnosis not present

## 2022-11-21 DIAGNOSIS — U071 COVID-19: Secondary | ICD-10-CM | POA: Diagnosis not present

## 2022-12-19 DIAGNOSIS — L814 Other melanin hyperpigmentation: Secondary | ICD-10-CM | POA: Diagnosis not present

## 2022-12-19 DIAGNOSIS — L720 Epidermal cyst: Secondary | ICD-10-CM | POA: Diagnosis not present

## 2022-12-19 DIAGNOSIS — D1801 Hemangioma of skin and subcutaneous tissue: Secondary | ICD-10-CM | POA: Diagnosis not present

## 2022-12-19 DIAGNOSIS — Z85828 Personal history of other malignant neoplasm of skin: Secondary | ICD-10-CM | POA: Diagnosis not present

## 2022-12-19 DIAGNOSIS — L821 Other seborrheic keratosis: Secondary | ICD-10-CM | POA: Diagnosis not present

## 2023-03-07 DIAGNOSIS — M810 Age-related osteoporosis without current pathological fracture: Secondary | ICD-10-CM | POA: Diagnosis not present

## 2023-04-04 DIAGNOSIS — M810 Age-related osteoporosis without current pathological fracture: Secondary | ICD-10-CM | POA: Diagnosis not present

## 2023-07-12 DIAGNOSIS — Z1231 Encounter for screening mammogram for malignant neoplasm of breast: Secondary | ICD-10-CM | POA: Diagnosis not present

## 2023-07-27 DIAGNOSIS — R7303 Prediabetes: Secondary | ICD-10-CM | POA: Diagnosis not present

## 2023-07-27 DIAGNOSIS — I10 Essential (primary) hypertension: Secondary | ICD-10-CM | POA: Diagnosis not present

## 2023-07-27 DIAGNOSIS — M810 Age-related osteoporosis without current pathological fracture: Secondary | ICD-10-CM | POA: Diagnosis not present

## 2023-07-27 DIAGNOSIS — Z23 Encounter for immunization: Secondary | ICD-10-CM | POA: Diagnosis not present

## 2023-07-27 DIAGNOSIS — E785 Hyperlipidemia, unspecified: Secondary | ICD-10-CM | POA: Diagnosis not present

## 2023-07-27 DIAGNOSIS — Z0000 Encounter for general adult medical examination without abnormal findings: Secondary | ICD-10-CM | POA: Diagnosis not present

## 2023-08-01 DIAGNOSIS — E785 Hyperlipidemia, unspecified: Secondary | ICD-10-CM | POA: Diagnosis not present

## 2023-08-01 DIAGNOSIS — I10 Essential (primary) hypertension: Secondary | ICD-10-CM | POA: Diagnosis not present

## 2023-08-01 DIAGNOSIS — M810 Age-related osteoporosis without current pathological fracture: Secondary | ICD-10-CM | POA: Diagnosis not present

## 2023-08-01 DIAGNOSIS — R7303 Prediabetes: Secondary | ICD-10-CM | POA: Diagnosis not present

## 2023-08-31 DIAGNOSIS — E7800 Pure hypercholesterolemia, unspecified: Secondary | ICD-10-CM | POA: Diagnosis not present

## 2023-08-31 DIAGNOSIS — M810 Age-related osteoporosis without current pathological fracture: Secondary | ICD-10-CM | POA: Diagnosis not present

## 2023-08-31 DIAGNOSIS — I10 Essential (primary) hypertension: Secondary | ICD-10-CM | POA: Diagnosis not present

## 2023-08-31 DIAGNOSIS — Z853 Personal history of malignant neoplasm of breast: Secondary | ICD-10-CM | POA: Diagnosis not present

## 2023-08-31 DIAGNOSIS — R7303 Prediabetes: Secondary | ICD-10-CM | POA: Diagnosis not present

## 2023-11-06 DIAGNOSIS — M810 Age-related osteoporosis without current pathological fracture: Secondary | ICD-10-CM | POA: Diagnosis not present

## 2023-11-09 DIAGNOSIS — Z961 Presence of intraocular lens: Secondary | ICD-10-CM | POA: Diagnosis not present

## 2023-11-09 DIAGNOSIS — D3132 Benign neoplasm of left choroid: Secondary | ICD-10-CM | POA: Diagnosis not present

## 2023-11-09 DIAGNOSIS — H35372 Puckering of macula, left eye: Secondary | ICD-10-CM | POA: Diagnosis not present

## 2023-11-09 DIAGNOSIS — H524 Presbyopia: Secondary | ICD-10-CM | POA: Diagnosis not present

## 2024-01-04 DIAGNOSIS — L814 Other melanin hyperpigmentation: Secondary | ICD-10-CM | POA: Diagnosis not present

## 2024-01-04 DIAGNOSIS — D489 Neoplasm of uncertain behavior, unspecified: Secondary | ICD-10-CM | POA: Diagnosis not present

## 2024-01-04 DIAGNOSIS — L821 Other seborrheic keratosis: Secondary | ICD-10-CM | POA: Diagnosis not present

## 2024-01-04 DIAGNOSIS — C44319 Basal cell carcinoma of skin of other parts of face: Secondary | ICD-10-CM | POA: Diagnosis not present

## 2024-01-04 DIAGNOSIS — Z85828 Personal history of other malignant neoplasm of skin: Secondary | ICD-10-CM | POA: Diagnosis not present

## 2024-01-04 DIAGNOSIS — L820 Inflamed seborrheic keratosis: Secondary | ICD-10-CM | POA: Diagnosis not present

## 2024-01-04 DIAGNOSIS — D1801 Hemangioma of skin and subcutaneous tissue: Secondary | ICD-10-CM | POA: Diagnosis not present

## 2024-01-10 DIAGNOSIS — Z1211 Encounter for screening for malignant neoplasm of colon: Secondary | ICD-10-CM | POA: Diagnosis not present

## 2024-01-10 DIAGNOSIS — R194 Change in bowel habit: Secondary | ICD-10-CM | POA: Diagnosis not present

## 2024-01-10 DIAGNOSIS — R109 Unspecified abdominal pain: Secondary | ICD-10-CM | POA: Diagnosis not present
